# Patient Record
Sex: Male | Born: 1980 | Race: White | Hispanic: No | Marital: Married | Smoking: Current every day smoker
Health system: Southern US, Community
[De-identification: ages and names within clinical notes are randomized; demographics above are authoritative.]

## PROBLEM LIST (undated history)

## (undated) DIAGNOSIS — S069X9A Unspecified intracranial injury with loss of consciousness of unspecified duration, initial encounter: Secondary | ICD-10-CM

## (undated) DIAGNOSIS — F419 Anxiety disorder, unspecified: Secondary | ICD-10-CM

## (undated) DIAGNOSIS — F1490 Cocaine use, unspecified, uncomplicated: Secondary | ICD-10-CM

## (undated) DIAGNOSIS — M1990 Unspecified osteoarthritis, unspecified site: Secondary | ICD-10-CM

## (undated) DIAGNOSIS — G43909 Migraine, unspecified, not intractable, without status migrainosus: Secondary | ICD-10-CM

## (undated) DIAGNOSIS — R569 Unspecified convulsions: Secondary | ICD-10-CM

## (undated) DIAGNOSIS — Z720 Tobacco use: Secondary | ICD-10-CM

## (undated) DIAGNOSIS — G44009 Cluster headache syndrome, unspecified, not intractable: Secondary | ICD-10-CM

## (undated) DIAGNOSIS — R51 Headache: Secondary | ICD-10-CM

## (undated) DIAGNOSIS — R519 Headache, unspecified: Secondary | ICD-10-CM

## (undated) DIAGNOSIS — F32A Depression, unspecified: Secondary | ICD-10-CM

## (undated) DIAGNOSIS — F329 Major depressive disorder, single episode, unspecified: Secondary | ICD-10-CM

## (undated) HISTORY — DX: Cluster headache syndrome, unspecified, not intractable: G44.009

## (undated) HISTORY — DX: Major depressive disorder, single episode, unspecified: F32.9

## (undated) HISTORY — DX: Migraine, unspecified, not intractable, without status migrainosus: G43.909

## (undated) HISTORY — DX: Unspecified osteoarthritis, unspecified site: M19.90

## (undated) HISTORY — DX: Anxiety disorder, unspecified: F41.9

## (undated) HISTORY — DX: Depression, unspecified: F32.A

---
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
## 2004-11-25 DIAGNOSIS — S069X9A Unspecified intracranial injury with loss of consciousness of unspecified duration, initial encounter: Secondary | ICD-10-CM

## 2004-11-25 DIAGNOSIS — S069XAA Unspecified intracranial injury with loss of consciousness status unknown, initial encounter: Secondary | ICD-10-CM

## 2004-11-25 HISTORY — PX: CRANIOTOMY: SHX93

## 2004-11-25 HISTORY — DX: Unspecified intracranial injury with loss of consciousness status unknown, initial encounter: S06.9XAA

## 2004-11-25 HISTORY — DX: Unspecified intracranial injury with loss of consciousness of unspecified duration, initial encounter: S06.9X9A

## 2009-10-23 ENCOUNTER — Emergency Department (HOSPITAL_COMMUNITY): Admission: EM | Admit: 2009-10-23 | Discharge: 2009-10-23 | Payer: Self-pay | Admitting: Emergency Medicine

## 2010-06-24 ENCOUNTER — Emergency Department (HOSPITAL_COMMUNITY): Admission: EM | Admit: 2010-06-24 | Discharge: 2010-06-25 | Payer: Self-pay | Admitting: Emergency Medicine

## 2010-12-31 ENCOUNTER — Emergency Department (HOSPITAL_COMMUNITY): Payer: Self-pay

## 2010-12-31 ENCOUNTER — Emergency Department (HOSPITAL_COMMUNITY)
Admission: EM | Admit: 2010-12-31 | Discharge: 2010-12-31 | Disposition: A | Discharge status: Home / Self Care | Payer: Self-pay | Attending: Emergency Medicine | Admitting: Emergency Medicine

## 2010-12-31 DIAGNOSIS — N201 Calculus of ureter: Secondary | ICD-10-CM | POA: Insufficient documentation

## 2010-12-31 DIAGNOSIS — R1032 Left lower quadrant pain: Secondary | ICD-10-CM | POA: Insufficient documentation

## 2010-12-31 DIAGNOSIS — R1110 Vomiting, unspecified: Secondary | ICD-10-CM | POA: Insufficient documentation

## 2010-12-31 DIAGNOSIS — R10814 Left lower quadrant abdominal tenderness: Secondary | ICD-10-CM | POA: Insufficient documentation

## 2010-12-31 LAB — URINALYSIS, ROUTINE W REFLEX MICROSCOPIC
Ketones, ur: 15 mg/dL — AB
Nitrite: NEGATIVE
pH: 6 (ref 5.0–8.0)

## 2010-12-31 LAB — URINE MICROSCOPIC-ADD ON

## 2010-12-31 MED ORDER — IOHEXOL 300 MG/ML  SOLN: 100.0000 mL | Freq: Once | INTRAMUSCULAR | Status: DC | PRN

## 2010-12-31 MED ORDER — IOHEXOL 300 MG/ML  SOLN
100.0000 mL | Freq: Once | INTRAMUSCULAR | Status: AC | PRN
  Administered 2010-12-31: 100 mL via INTRAVENOUS

## 2011-02-15 LAB — COMPREHENSIVE METABOLIC PANEL
ALT: 33 U/L (ref 0–53)
AST: 23 U/L (ref 0–37)
Calcium: 9.7 mg/dL (ref 8.4–10.5)
Creatinine, Ser: 1.23 mg/dL (ref 0.4–1.5)
GFR calc non Af Amer: >60 mL/min (ref >60)
Potassium: 4.3 mEq/L (ref 3.5–5.1)
Total Bilirubin: 0.8 mg/dL (ref 0.3–1.2)
Total Protein: 7.2 g/dL (ref 6.0–8.3)

## 2011-02-15 LAB — CBC
HCT: 45.5 % (ref 39.0–52.0)
MCH: 31 pg (ref 26.0–34.0)
MCHC: 34.7 g/dL (ref 30.0–36.0)
RDW: 13.2 % (ref 11.5–15.5)

## 2011-02-15 LAB — DIFFERENTIAL
Basophils Absolute: 0 10*3/uL (ref 0.0–0.1)
Basophils Relative: 0 % (ref 0–1)
Eosinophils Relative: 0 % (ref 0–5)
Lymphocytes Relative: 9 % — ABNORMAL LOW (ref 12–46)
Lymphs Abs: 1.3 10*3/uL (ref 0.7–4.0)
Monocytes Absolute: 0.9 10*3/uL (ref 0.1–1.0)
Neutro Abs: 12.6 10*3/uL — ABNORMAL HIGH (ref 1.7–7.7)
Neutrophils Relative %: 85 % — ABNORMAL HIGH (ref 43–77)

## 2011-02-15 LAB — LIPASE, BLOOD: Lipase: 21 U/L (ref 11–59)

## 2011-10-10 ENCOUNTER — Emergency Department (HOSPITAL_COMMUNITY)
Admission: EM | Admit: 2011-10-10 | Discharge: 2011-10-10 | Disposition: A | Discharge status: Home / Self Care | Payer: Medicaid Other | Attending: Emergency Medicine | Admitting: Emergency Medicine

## 2011-10-10 ENCOUNTER — Encounter: Payer: Self-pay | Admitting: Emergency Medicine

## 2011-10-10 DIAGNOSIS — W260XXA Contact with knife, initial encounter: Secondary | ICD-10-CM | POA: Insufficient documentation

## 2011-10-10 DIAGNOSIS — F17200 Nicotine dependence, unspecified, uncomplicated: Secondary | ICD-10-CM | POA: Insufficient documentation

## 2011-10-10 DIAGNOSIS — S1191XA Laceration without foreign body of unspecified part of neck, initial encounter: Secondary | ICD-10-CM

## 2011-10-10 DIAGNOSIS — S1190XA Unspecified open wound of unspecified part of neck, initial encounter: Secondary | ICD-10-CM | POA: Insufficient documentation

## 2011-10-10 DIAGNOSIS — Z87820 Personal history of traumatic brain injury: Secondary | ICD-10-CM | POA: Insufficient documentation

## 2011-10-10 HISTORY — DX: Headache, unspecified: R51.9

## 2011-10-10 HISTORY — DX: Unspecified intracranial injury with loss of consciousness of unspecified duration, initial encounter: S06.9X9A

## 2011-10-10 HISTORY — DX: Headache: R51

## 2011-10-10 MED ORDER — TETANUS-DIPHTH-ACELL PERTUSSIS 5-2.5-18.5 LF-MCG/0.5 IM SUSP
0.5000 mL | Freq: Once | INTRAMUSCULAR | Status: AC
  Administered 2011-10-10: 0.5 mL via INTRAMUSCULAR
  Filled 2011-10-10: qty 0.5
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
## 2011-10-10 NOTE — ED Notes (Signed)
ASSUMED CARE ON PT. , RESTING WITH NO PAIN OR DISCOMFORT , RESPIRATIONS UNLABORED, INTRODUCED SELF , CALL LIGHT WITH IN REACH , BEDRAILS UP.

## 2011-10-10 NOTE — ED Notes (Signed)
DR. Anitra Lauth AT BEDSIDE SUTURING PT.'S LACERATION.

## 2012-10-26 ENCOUNTER — Emergency Department (HOSPITAL_COMMUNITY)
Admission: EM | Admit: 2012-10-26 | Discharge: 2012-10-26 | Disposition: A | Discharge status: Home / Self Care | Payer: Medicaid Other | Attending: Emergency Medicine | Admitting: Emergency Medicine

## 2012-10-26 DIAGNOSIS — S0100XA Unspecified open wound of scalp, initial encounter: Secondary | ICD-10-CM | POA: Insufficient documentation

## 2012-10-26 DIAGNOSIS — Z79899 Other long term (current) drug therapy: Secondary | ICD-10-CM | POA: Insufficient documentation

## 2012-10-26 DIAGNOSIS — Z87820 Personal history of traumatic brain injury: Secondary | ICD-10-CM | POA: Insufficient documentation

## 2012-10-26 DIAGNOSIS — R51 Headache: Secondary | ICD-10-CM | POA: Insufficient documentation

## 2012-10-26 DIAGNOSIS — IMO0002 Reserved for concepts with insufficient information to code with codable children: Secondary | ICD-10-CM | POA: Insufficient documentation

## 2012-10-26 DIAGNOSIS — T148XXA Other injury of unspecified body region, initial encounter: Secondary | ICD-10-CM

## 2012-10-26 DIAGNOSIS — F17200 Nicotine dependence, unspecified, uncomplicated: Secondary | ICD-10-CM | POA: Insufficient documentation

## 2012-10-26 DIAGNOSIS — W503XXA Accidental bite by another person, initial encounter: Secondary | ICD-10-CM

## 2012-10-26 MED ORDER — SULFAMETHOXAZOLE-TRIMETHOPRIM 800-160 MG PO TABS: 1.0000 | ORAL_TABLET | Freq: Two times a day (BID) | ORAL | Status: DC

## 2012-10-26 MED ORDER — CLINDAMYCIN HCL 150 MG PO CAPS: 450.0000 mg | ORAL_CAPSULE | Freq: Three times a day (TID) | ORAL | Status: DC

## 2012-10-26 MED ORDER — TETANUS-DIPHTH-ACELL PERTUSSIS 5-2.5-18.5 LF-MCG/0.5 IM SUSP
0.5000 mL | Freq: Once | INTRAMUSCULAR | Status: AC
  Administered 2012-10-26: 0.5 mL via INTRAMUSCULAR
  Filled 2012-10-26: qty 0.5
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
## 2012-10-28 NOTE — ED Provider Notes (Signed)
Medical screening examination/treatment/procedure(s) were performed by non-physician practitioner and as supervising physician I was immediately available for consultation/collaboration.   Joya Gaskins, MD 10/28/12 [phone]

## 2013-10-26 DIAGNOSIS — Z0289 Encounter for other administrative examinations: Secondary | ICD-10-CM

## 2014-06-14 ENCOUNTER — Encounter (HOSPITAL_COMMUNITY): Payer: Self-pay | Admitting: Emergency Medicine

## 2014-06-14 ENCOUNTER — Emergency Department (HOSPITAL_COMMUNITY)
Admission: EM | Admit: 2014-06-14 | Discharge: 2014-06-14 | Disposition: A | Discharge status: Home / Self Care | Payer: Self-pay | Attending: Emergency Medicine | Admitting: Emergency Medicine

## 2014-06-14 ENCOUNTER — Emergency Department (HOSPITAL_COMMUNITY): Payer: Medicaid Other

## 2014-06-14 DIAGNOSIS — R1013 Epigastric pain: Secondary | ICD-10-CM | POA: Insufficient documentation

## 2014-06-14 DIAGNOSIS — Z880 Allergy status to penicillin: Secondary | ICD-10-CM | POA: Insufficient documentation

## 2014-06-14 DIAGNOSIS — F17200 Nicotine dependence, unspecified, uncomplicated: Secondary | ICD-10-CM | POA: Insufficient documentation

## 2014-06-14 DIAGNOSIS — Z87820 Personal history of traumatic brain injury: Secondary | ICD-10-CM | POA: Insufficient documentation

## 2014-06-14 DIAGNOSIS — Z79899 Other long term (current) drug therapy: Secondary | ICD-10-CM | POA: Insufficient documentation

## 2014-06-14 DIAGNOSIS — R1031 Right lower quadrant pain: Secondary | ICD-10-CM | POA: Insufficient documentation

## 2014-06-14 DIAGNOSIS — G40909 Epilepsy, unspecified, not intractable, without status epilepticus: Secondary | ICD-10-CM | POA: Insufficient documentation

## 2014-06-14 DIAGNOSIS — K5000 Crohn's disease of small intestine without complications: Secondary | ICD-10-CM | POA: Insufficient documentation

## 2014-06-14 DIAGNOSIS — R509 Fever, unspecified: Secondary | ICD-10-CM | POA: Insufficient documentation

## 2014-06-14 DIAGNOSIS — R112 Nausea with vomiting, unspecified: Secondary | ICD-10-CM | POA: Insufficient documentation

## 2014-06-14 DIAGNOSIS — D72829 Elevated white blood cell count, unspecified: Secondary | ICD-10-CM | POA: Insufficient documentation

## 2014-06-14 DIAGNOSIS — R197 Diarrhea, unspecified: Secondary | ICD-10-CM | POA: Insufficient documentation

## 2014-06-14 HISTORY — DX: Unspecified convulsions: R56.9

## 2014-06-14 LAB — CBC WITH DIFFERENTIAL/PLATELET
BASOS ABS: 0 10*3/uL (ref 0.0–0.1)
Basophils Relative: 0 % (ref 0–1)
Eosinophils Absolute: 0 10*3/uL (ref 0.0–0.7)
Eosinophils Relative: 0 % (ref 0–5)
HEMATOCRIT: 46 % (ref 39.0–52.0)
HEMOGLOBIN: 15.9 g/dL (ref 13.0–17.0)
LYMPHS ABS: 2.1 10*3/uL (ref 0.7–4.0)
Lymphocytes Relative: 15 % (ref 12–46)
MCH: 31.2 pg (ref 26.0–34.0)
MCHC: 34.6 g/dL (ref 30.0–36.0)
MCV: 90.4 fL (ref 78.0–100.0)
Monocytes Absolute: 1.2 10*3/uL — ABNORMAL HIGH (ref 0.1–1.0)
Monocytes Relative: 9 % (ref 3–12)
NEUTROS PCT: 76 % (ref 43–77)
Neutro Abs: 10.9 10*3/uL — ABNORMAL HIGH (ref 1.7–7.7)
PLATELETS: 173 10*3/uL (ref 150–400)
RBC: 5.09 MIL/uL (ref 4.22–5.81)
RDW: 14.2 % (ref 11.5–15.5)
WBC: 14.2 10*3/uL — ABNORMAL HIGH (ref 4.0–10.5)

## 2014-06-14 LAB — URINE MICROSCOPIC-ADD ON

## 2014-06-14 LAB — URINALYSIS, ROUTINE W REFLEX MICROSCOPIC
GLUCOSE, UA: NEGATIVE mg/dL
HGB URINE DIPSTICK: NEGATIVE
Ketones, ur: NEGATIVE mg/dL
Leukocytes, UA: NEGATIVE
NITRITE: NEGATIVE
PROTEIN: 30 mg/dL — AB
Specific Gravity, Urine: 1.03 (ref 1.005–1.030)
Urobilinogen, UA: 0.2 mg/dL (ref 0.0–1.0)
pH: 5.5 (ref 5.0–8.0)

## 2014-06-14 LAB — COMPREHENSIVE METABOLIC PANEL
ALT: 21 U/L (ref 0–53)
AST: 17 U/L (ref 0–37)
Albumin: 3.9 g/dL (ref 3.5–5.2)
Alkaline Phosphatase: 68 U/L (ref 39–117)
Anion gap: 15 (ref 5–15)
BILIRUBIN TOTAL: 0.4 mg/dL (ref 0.3–1.2)
BUN: 18 mg/dL (ref 6–23)
CHLORIDE: 102 meq/L (ref 96–112)
CO2: 20 meq/L (ref 19–32)
CREATININE: 1.07 mg/dL (ref 0.50–1.35)
Calcium: 9.3 mg/dL (ref 8.4–10.5)
GFR calc non Af Amer: >90 mL/min (ref >90)
Glucose, Bld: 111 mg/dL — ABNORMAL HIGH (ref 70–99)
POTASSIUM: 3.5 meq/L — AB (ref 3.7–5.3)
Sodium: 137 mEq/L (ref 137–147)
TOTAL PROTEIN: 8 g/dL (ref 6.0–8.3)

## 2014-06-14 LAB — LIPASE, BLOOD: LIPASE: 29 U/L (ref 11–59)

## 2014-06-14 MED ORDER — ONDANSETRON HCL 4 MG PO TABS: 4.0000 mg | ORAL_TABLET | Freq: Four times a day (QID) | ORAL | Status: DC

## 2014-06-14 MED ORDER — METRONIDAZOLE 500 MG PO TABS: 500.0000 mg | ORAL_TABLET | Freq: Three times a day (TID) | ORAL | Status: DC

## 2014-06-14 MED ORDER — IOHEXOL 300 MG/ML  SOLN
100.0000 mL | Freq: Once | INTRAMUSCULAR | Status: AC | PRN
  Administered 2014-06-14: 100 mL via INTRAVENOUS

## 2014-06-14 MED ORDER — MORPHINE SULFATE 4 MG/ML IJ SOLN
4.0000 mg | Freq: Once | INTRAMUSCULAR | Status: AC
  Administered 2014-06-14: 4 mg via INTRAVENOUS
  Filled 2014-06-14: qty 1

## 2014-06-14 MED ORDER — CIPROFLOXACIN IN D5W 400 MG/200ML IV SOLN
400.0000 mg | Freq: Once | INTRAVENOUS | Status: AC
  Administered 2014-06-14: 400 mg via INTRAVENOUS
  Filled 2014-06-14: qty 200

## 2014-06-14 MED ORDER — ONDANSETRON 4 MG PO TBDP
8.0000 mg | ORAL_TABLET | Freq: Once | ORAL | Status: AC
  Administered 2014-06-14: 8 mg via ORAL
  Filled 2014-06-14: qty 2

## 2014-06-14 MED ORDER — IOHEXOL 300 MG/ML  SOLN
25.0000 mL | INTRAMUSCULAR | Status: DC | PRN
Start: 2014-06-14 — End: 2014-06-15
  Administered 2014-06-14: 25 mL via ORAL

## 2014-06-14 MED ORDER — SODIUM CHLORIDE 0.9 % IV BOLUS (SEPSIS)
1000.0000 mL | Freq: Once | INTRAVENOUS | Status: AC
  Administered 2014-06-14: 1000 mL via INTRAVENOUS

## 2014-06-14 MED ORDER — CIPROFLOXACIN HCL 500 MG PO TABS: 500.0000 mg | ORAL_TABLET | Freq: Two times a day (BID) | ORAL | Status: DC

## 2014-06-14 MED ORDER — OXYCODONE-ACETAMINOPHEN 5-325 MG PO TABS
1.0000 | ORAL_TABLET | Freq: Once | ORAL | Status: AC
  Administered 2014-06-14: 1 via ORAL
  Filled 2014-06-14: qty 1

## 2014-06-14 MED ORDER — METRONIDAZOLE IN NACL 5-0.79 MG/ML-% IV SOLN
500.0000 mg | Freq: Once | INTRAVENOUS | Status: AC
  Administered 2014-06-14: 500 mg via INTRAVENOUS
  Filled 2014-06-14: qty 100

## 2014-06-14 MED ORDER — OXYCODONE-ACETAMINOPHEN 5-325 MG PO TABS: 1.0000 | ORAL_TABLET | Freq: Four times a day (QID) | ORAL | Status: DC | PRN

## 2014-06-14 MED ORDER — ONDANSETRON HCL 4 MG/2ML IJ SOLN
4.0000 mg | Freq: Once | INTRAMUSCULAR | Status: AC
  Administered 2014-06-14: 4 mg via INTRAVENOUS
  Filled 2014-06-14: qty 2

## 2014-06-14 NOTE — ED Notes (Signed)
Pt continues to be monitored by 5 lead, blood pressure, and pulse ox.  

## 2014-06-14 NOTE — ED Notes (Signed)
Fluid challenge given denies nausea, but continues to diarrhea.

## 2014-06-14 NOTE — ED Notes (Signed)
Pt alert x4 ambulates without distress. 

## 2014-06-14 NOTE — Discharge Instructions (Signed)
You have inflammation of your bowel which could be related to inflammatory bowel disease or infection.  You will be given antibiotics.  YOu need to follow-up with Dr. Juanda Chance, GI this week.  INformation about Crohn's Disease Crohn's disease is a long-term (chronic) soreness and redness (inflammation) of the intestines (bowel). It can affect any portion of the digestive tract, from the mouth to the anus. It can also cause problems outside the digestive tract. Crohn's disease is closely related to a disease called ulcerative colitis (together, these two diseases are called inflammatory bowel disease).  CAUSES  The cause of Crohn's disease is not known. One Nelva Bush is that, in an easily affected person, the immune system is triggered to attack the body's own digestive tissue. Crohn's disease runs in families. It seems to be more common in certain geographic areas and amongst certain races. There are no clear-cut dietary causes.  SYMPTOMS  Crohn's disease can cause many different symptoms since it can affect many different parts of the body. Symptoms include:  Fatigue.  Weight loss.  Chronic diarrhea, sometime bloody.  Abdominal pain and cramps.  Fever.  Ulcers or canker sores in the mouth or rectum.  Anemia (low red blood cells).  Arthritis, skin problems, and eye problems may occur. Complications of Crohn's disease can include:  Series of holes (perforation) of the bowel.  Portions of the intestines sticking to each other (adhesions).  Obstruction of the bowel.  Fistula formation, typically in the rectal area but also in other areas. A fistula is an opening between the bowels and the outside, or between the bowels and another organ.  A painful crack in the mucous membrane of the anus (rectal fissure). DIAGNOSIS  Your caregiver may suspect Crohn's disease based on your symptoms and an exam. Blood tests may confirm that there is a problem. You may be asked to submit a stool specimen for  examination. X-rays and CT scans may be necessary. Ultimately, the diagnosis is usually made after a procedure that uses a flexible tube that is inserted via your mouth or your anus. This is done under sedation and is called either an upper endoscopy or colonoscopy. With these tests, the specialist can take tiny tissue samples and remove them from the inside of the bowel (biopsy). Examination of this biopsy tissue under a microscope can reveal Crohn's disease as the cause of your symptoms. Due to the many different forms that Crohn's disease can take, symptoms may be present for several years before a diagnosis is made. TREATMENT  Medications are often used to decrease inflammation and control the immune system. These include medicines related to aspirin, steroid medications, and newer and stronger medications to slow down the immune system. Some medications may be used as suppositories or enemas. A number of other medications are used or have been studied. Your caregiver will make specific recommendations. HOME CARE INSTRUCTIONS   Symptoms such as diarrhea can be controlled with medications. Avoid foods that have a laxative effect such as fresh fruit, vegetables and dairy products. During flare ups, you can rest your bowel by refraining from solid foods. Drink clear liquids frequently during the day (electrolyte or re-hydrating fluids are best. Your caregiver can help you with suggestions). Drink often to prevent loss of body fluids (dehydration). When diarrhea has cleared, eat small meals and more frequently. Avoid food additives and stimulants such as caffeine (coffee, tea, or chocolate). Enzyme supplements may help if you develop intolerance to a sugar in dairy products (lactose). Ask your  caregiver or dietitian about specific dietary instructions.  Try to maintain a positive attitude. Learn relaxation techniques such as self hypnosis, mental imaging, and muscle relaxation.  If possible, avoid stresses  which can aggravate your condition.  Exercise regularly.  Follow your diet.  Always get plenty of rest. SEEK MEDICAL CARE IF:   Your symptoms fail to improve after a week or two of new treatment.  You experience continued weight loss.  You have ongoing cramps or loose bowels.  You develop a new skin rash, skin sores, or eye problems. SEEK IMMEDIATE MEDICAL CARE IF:   You have worsening of your symptoms or develop new symptoms.  You have a fever.  You develop bloody diarrhea.  You develop severe abdominal pain. MAKE SURE YOU:   Understand these instructions.  Will watch your condition.  Will get help right away if you are not doing well or get worse. Document Released: 08/21/2005 Document Revised: 03/08/2013 Document Reviewed: 07/20/2007 [facility]cExitCare Patient Information 2015 [employer], [place]. This information is not intended to replace advice given to you by your health care provider. Make sure you discuss any questions you have with your health care provider.

## 2014-06-14 NOTE — ED Notes (Signed)
Pt placed into gown and on monitor upon arrival to room. Pt monitored by 5 lead, blood pressure, and pulse ox. Asked pt to provide urine specimen pt stated he could not provide one at this time.

## 2014-06-14 NOTE — ED Notes (Addendum)
Pt reports generalized abd pain, back pain and having n/v/d and fever. Also having some pain with urination.

## 2014-06-14 NOTE — ED Provider Notes (Signed)
CSN: [id number]     Arrival date & time 06/14/14  1245 History   First MD Initiated Contact with Patient 06/14/14 1436     Chief Complaint  Patient presents with  . Fever  . Abdominal Pain     (Consider location/radiation/quality/duration/timing/severity/associated sxs/prior Treatment) HPI  This is a 33 year old male who presents with abdominal pain, nausea, vomiting, diarrhea and fever. Patient reports 2 days of symptoms. He reports temperature to 102. He reports. Umbilical abdominal pain that feels like "punching" it is nonradiating. Denies any right lower part her pain. Tylenol has helped. He reports anorexia and pain that is worse with eating. He endorses nonbilious, nonbloody emesis and diarrhea. No known sick contacts  Past Medical History  Diagnosis Date  . TBI (traumatic brain injury)   . Headache in back of head   . Seizures    History reviewed. No pertinent past surgical history. History reviewed. No pertinent family history. History  Substance Use Topics  . Smoking status: Current Every Day Smoker  . Smokeless tobacco: Not on file  . Alcohol Use: No    Review of Systems  Constitutional: Positive for fever and chills.  Respiratory: Negative.  Negative for chest tightness and shortness of breath.   Cardiovascular: Negative.  Negative for chest pain.  Gastrointestinal: Positive for nausea, vomiting, abdominal pain and diarrhea. Negative for constipation.  Genitourinary: Negative.  Negative for dysuria.  Musculoskeletal: Negative for back pain.  Skin: Negative for rash.  Neurological: Negative for headaches.  All other systems reviewed and are negative.     Allergies  Penicillins  Home Medications   Prior to Admission medications   Medication Sig Start Date End Date Taking? Authorizing Provider  levETIRAcetam (KEPPRA) 500 MG tablet Take 500 mg by mouth daily.   Yes Historical Provider, MD  ciprofloxacin (CIPRO) 500 MG tablet Take 1 tablet (500 mg total) by  mouth every 12 (twelve) hours. 06/14/14   Shon Baton, MD  metroNIDAZOLE (FLAGYL) 500 MG tablet Take 1 tablet (500 mg total) by mouth 3 (three) times daily. 06/14/14   Shon Baton, MD  ondansetron (ZOFRAN) 4 MG tablet Take 1 tablet (4 mg total) by mouth every 6 (six) hours. 06/14/14   Shon Baton, MD  oxyCODONE-acetaminophen (PERCOCET/ROXICET) 5-325 MG per tablet Take 1-2 tablets by mouth every 6 (six) hours as needed for moderate pain or severe pain. 06/14/14   Shon Baton, MD   BP 127/67  Pulse 97  Temp(Src) 99.7 F (37.6 C)  Resp 28  Ht 5\' 8"  (1.727 m)  Wt 180 lb (81.647 kg)  BMI 27.38 kg/m2  SpO2 97% Physical Exam  Nursing note and vitals reviewed. Constitutional: He is oriented to person, place, and time. He appears well-developed and well-nourished. No distress.  HENT:  Head: Normocephalic and atraumatic.  Mouth/Throat: Oropharynx is clear and moist.  Eyes: Pupils are equal, round, and reactive to light.  Neck: Neck supple.  Cardiovascular: Normal rate, regular rhythm and normal heart sounds.   No murmur heard. Pulmonary/Chest: Effort normal and breath sounds normal. No respiratory distress. He has no wheezes.  Abdominal: Soft. Bowel sounds are normal. There is tenderness. There is no rebound.  Tenderness over the epigastrium and right lower quadrant, positive Rovsing's  Musculoskeletal: He exhibits no edema.  Lymphadenopathy:    He has no cervical adenopathy.  Neurological: He is alert and oriented to person, place, and time.  Skin: Skin is warm and dry.  Psychiatric: He has a normal mood and  affect.    ED Course  Procedures (including critical care time) Labs Review Labs Reviewed  CBC WITH DIFFERENTIAL - Abnormal; Notable for the following:    WBC 14.2 (*)    Neutro Abs 10.9 (*)    Monocytes Absolute 1.2 (*)    All other components within normal limits  COMPREHENSIVE METABOLIC PANEL - Abnormal; Notable for the following:    Potassium 3.5 (*)     Glucose, Bld 111 (*)    All other components within normal limits  URINALYSIS, ROUTINE W REFLEX MICROSCOPIC - Abnormal; Notable for the following:    APPearance CLOUDY (*)    Bilirubin Urine SMALL (*)    Protein, ur 30 (*)    All other components within normal limits  URINE MICROSCOPIC-ADD ON - Abnormal; Notable for the following:    Bacteria, UA FEW (*)    Casts HYALINE CASTS (*)    All other components within normal limits  LIPASE, BLOOD    Imaging Review Ct Abdomen Pelvis W Contrast  06/14/2014   CLINICAL DATA:  Right mid to lower abdominal pain with fever.  EXAM: CT ABDOMEN AND PELVIS WITH CONTRAST  TECHNIQUE: Multidetector CT imaging of the abdomen and pelvis was performed using the standard protocol following bolus administration of intravenous contrast.  CONTRAST:  100mL OMNIPAQUE IOHEXOL 300 MG/ML  SOLN  COMPARISON:  12/31/2010  FINDINGS: Lower chest: Clear lung bases. Normal heart size without pericardial or pleural effusion. A tiny hiatal hernia.  Liver:  Normal, without focal lesion.  Spleen: Normal  Stomach: Normal distal stomach.  Pancreas: Normal, without mass or pancreatic ductal dilatation.  Gallbladder/Biliary Tree: Normal gallbladder, without intra or extrahepatic biliary ductal dilatation.  Kidneys/Adrenals: Normal adrenal glands. Normal appearance of the kidneys, without hydronephrosis.  Bowel loops: Suspicion of wall thickening, mild, involving the hepatic flexure and superior ascending colon. Moderate wall thickening is identified within the cecum and region of the ileocecal valve. Example image 47/series 201. There is moderate terminal ileal wall thickening, including on image 56/series 201.  Normal appendix.  Mildly prominent nodes in the ileocolic mesentery. These measure maximally 7 mm on image 49. Small bowel otherwise normal, without ascites.  Vascular: Mild but age advanced atherosclerosis, including within the right common iliac artery on image 4362.  Nodes: No  retroperitoneal or retrocrural adenopathy. No pelvic adenopathy.  Pelvic Genitourinary: Normal urinary bladder and prostate.  Other: No significant free fluid.  Bones/Musculoskeletal: Degenerative disc disease at the lumbosacral junction and L1-2 levels.  IMPRESSION: 1. Wall thickening involving the terminal ileum an adjacent cecum/proximal ascending colon. This could represent infectious enteritis/colitis versus inflammatory bowel disease (i.e. Crohn disease). Adjacent nodes in the mesentery are likely reactive. No evidence of obstruction, abscess, or other acute complication. 2. More equivocal hepatic flexure colonic wall thickening. This could be partially due to underdistention. 3. Mild but age advanced atherosclerosis.   Electronically Signed   By: Jeronimo GreavesKyle  Talbot M.D.   On: 06/14/2014 18:58     EKG Interpretation None      MDM   Final diagnoses:  Terminal ileitis, without complications    Patient presents with abdominal pain, vomiting and diarrhea. He is nontoxic on exam but appears dry. Tender to palpation. Considerations include colitis and appendicitis. Patient was given fluids, Zofran, and pain medication. He was sent for CT scan of the abdomen. Labs notable for leukocytosis. CT scan shows evidence of terminal ileitis. Patient would like to go home if at all possible. Discussed with Dr. Juanda ChanceBrodie, GI given concerns  for possible inflammatory bowel disease. Will place on Cipro and Flagyl and hold off on steroids at this time. He will need close GI followup. He is to call her office tomorrow. Patient was able to tolerate fluids. He will be discharged home on Cipro and Flagyl, pain medication, and nausea medicine. Patient was given strict return precautions.  After history, exam, and medical workup I feel the patient has been appropriately medically screened and is safe for discharge home. Pertinent diagnoses were discussed with the patient. Patient was given return precautions.     Shon Baton, MD 06/14/14 2031

## 2014-06-14 NOTE — ED Notes (Signed)
Patient transported to CT 

## 2014-06-15 ENCOUNTER — Encounter: Payer: Self-pay | Admitting: *Deleted

## 2014-06-15 ENCOUNTER — Telehealth: Payer: Self-pay | Admitting: *Deleted

## 2014-06-15 NOTE — Telephone Encounter (Signed)
Hart Carwinora M Brodie, MD Daphine Deutscheregina N Chayanne Speir, RN            Hello Rene Kocheregina, this pt was seen in ED tonight with ileitis on CT scan, may have infectious ileitis or Crohn's. He was started on Cipro/Flagyl and will call the office in am attn Mariaha Ellington to put him on anybodie's schedule, incuding extenders. Tjhanks DB         Left a message for patient to call me.

## 2014-06-15 NOTE — Telephone Encounter (Signed)
Spoke with patient's girlfriend and scheduled OV on 06/16/14 at 10:30 AM with Mike GipAmy Esterwood, PA.

## 2014-06-15 NOTE — Telephone Encounter (Signed)
error 

## 2014-06-16 ENCOUNTER — Encounter: Payer: Self-pay | Admitting: Physician Assistant

## 2014-06-16 ENCOUNTER — Other Ambulatory Visit: Payer: Self-pay | Admitting: *Deleted

## 2014-06-16 ENCOUNTER — Telehealth: Payer: Self-pay | Admitting: Physician Assistant

## 2014-06-16 ENCOUNTER — Other Ambulatory Visit: Payer: Medicaid Other

## 2014-06-16 ENCOUNTER — Ambulatory Visit (INDEPENDENT_AMBULATORY_CARE_PROVIDER_SITE_OTHER): Payer: Medicaid Other | Admitting: Physician Assistant

## 2014-06-16 VITALS — BP 120/72 | HR 88 | Ht 68.0 in | Wt 173.0 lb

## 2014-06-16 DIAGNOSIS — S069XAA Unspecified intracranial injury with loss of consciousness status unknown, initial encounter: Secondary | ICD-10-CM | POA: Insufficient documentation

## 2014-06-16 DIAGNOSIS — G40909 Epilepsy, unspecified, not intractable, without status epilepticus: Secondary | ICD-10-CM | POA: Insufficient documentation

## 2014-06-16 DIAGNOSIS — S069X9A Unspecified intracranial injury with loss of consciousness of unspecified duration, initial encounter: Secondary | ICD-10-CM | POA: Insufficient documentation

## 2014-06-16 DIAGNOSIS — A09 Infectious gastroenteritis and colitis, unspecified: Secondary | ICD-10-CM

## 2014-06-16 MED ORDER — OXYCODONE-ACETAMINOPHEN 5-325 MG PO TABS: 1.0000 | ORAL_TABLET | Freq: Four times a day (QID) | ORAL | Status: DC | PRN

## 2014-06-16 MED ORDER — ONDANSETRON 4 MG PO TBDP: 4.0000 mg | ORAL_TABLET | Freq: Once | ORAL | Status: DC

## 2014-06-16 MED ORDER — DICYCLOMINE HCL 10 MG PO CAPS: ORAL_CAPSULE | ORAL | Status: DC

## 2014-06-16 MED ORDER — ONDANSETRON 4 MG PO TBDP: 4.0000 mg | ORAL_TABLET | Freq: Three times a day (TID) | ORAL | Status: DC | PRN

## 2014-06-16 NOTE — Progress Notes (Addendum)
Subjective:    Patient ID: Ryan Blackburn, male    DOB: [birth year], 33 y.o.   MRN: [id number]  HPI  Ryan Blackburn is a pleasant 33 year old white male new to GI today referred through the emergency room. Patient has history of ureteral lithiasis, previous traumatic brain injury and a seizure disorder. He has no prior history of GI problems. He went to the emergency room on 06/14/2014 with a 2 day illness of abrupt onset with abdominal pain cramping watery diarrhea, nausea vomiting fever and chills. He says he was feeling fine and had not had any recent GI issues until he suddenly became ill. He is not aware of any infectious exposures, had eaten pizza and chicken salad the day prior to becoming ill. No other family members ill no recent antibiotics. Evaluation in the emergency room labs showed a WBC of 14.2 hemoglobin 15.9 hematocrit of 46 he met unremarkable. CT scan of the abdomen and pelvis was done and shows suspected wall thickening of the hepatic flexure and descending colon to the cecum and ileocecal valve and into the terminal ileum. Patient was started on Cipro and Flagyl which he is taking.  He says so far he really doesn't feel much better -he is not having as much  Fever- which had been to 102.2 the day of the emergency room visit. He continues with abdominal pain and cramping. He is trying to take po's. Though  had been told to stay on clear liquids. He continues to have 14-16 watery bowel movements per day sometimes very small volume "squirts" of stool. Stools have been nonbloody. He is still nauseated ,but not vomiting as often.    Review of Systems  Constitutional: Positive for appetite change.  HENT: Negative.   Eyes: Negative.   Respiratory: Negative.   Cardiovascular: Negative.   Gastrointestinal: Positive for nausea, vomiting, abdominal pain and diarrhea.  Endocrine: Negative.   Genitourinary: Negative.   Musculoskeletal: Negative.   Skin: Negative.   Allergic/Immunologic: Negative.     Neurological: Positive for weakness.  Hematological: Negative.   Psychiatric/Behavioral: Negative.    Outpatient Prescriptions Prior to Visit  Medication Sig Dispense Refill  . ciprofloxacin (CIPRO) 500 MG tablet Take 1 tablet (500 mg total) by mouth every 12 (twelve) hours.  28 tablet  0  . levETIRAcetam (KEPPRA) 500 MG tablet Take 500 mg by mouth daily.      . metroNIDAZOLE (FLAGYL) 500 MG tablet Take 1 tablet (500 mg total) by mouth 3 (three) times daily.  42 tablet  0  . ondansetron (ZOFRAN) 4 MG tablet Take 1 tablet (4 mg total) by mouth every 6 (six) hours.  12 tablet  0  . oxyCODONE-acetaminophen (PERCOCET/ROXICET) 5-325 MG per tablet Take 1-2 tablets by mouth every 6 (six) hours as needed for moderate pain or severe pain.  15 tablet  0   No facility-administered medications prior to visit.   Allergies  Allergen Reactions  . Penicillins Hives   Patient Active Problem List   Diagnosis Date Noted  . Seizure disorder 06/16/2014  . TBI (traumatic brain injury) 06/16/2014   History  Substance Use Topics  . Smoking status: Current Every Day Smoker  . Smokeless tobacco: Not on file  . Alcohol Use: No   has no family status information on file.     Objective:  Physical Exam   well-developed young white male in no acute distress, blood pressure 120/72 pulse 88 height 5 foot 8 weight 173. HEENT; nontraumatic normocephalic EOMI PERRLA sclera anicteric, Supple;  no JVD, Cardiovascular; regular rate and rhythm with S1-S2 no murmur or gallop, Pulmonary; clear bilaterally, Abdomen; soft bowel sounds are hyperactive he is tender across his lower abdomen right greater than left no guarding or rebound no palpable mass or hepatosplenomegaly, Rectal ;exam not done, Extremities; no clubbing ,cyanosis or edema skin warm and dry, Psych; mood and affect appropriate      Assessment & Plan:  #23  33 year old white male with acute diarrheal illness x5 days associated with fever chills abdominal  cramping profuse watery diarrhea nausea and vomiting. He had a leukocytosis but other labs unremarkable. CT scan showed thickening in the terminal ileum and and ascending colon . I suspect this is all infectious in etiology ,though  will have to consider underlying IBD ,this is felt less likely. #2 seizure disorder  Plan; GI pathogen panel Push oral fluids and advance to soft bland diet Patient is encouraged to finish his current course of Cipro and Flagyl x7 days Refill Zofran 4 mg every 6 hours when necessary for nausea Add Bentyl 10 mg a.c. for cramping and pain Refill Percocet 1 by mouth q. 6 hours when necessary for pain #20 no refills. . Patient is advised that this is very likely infectious and  that he should improve and resolve feels illness within the next week or so. Will make and I followup office visit in about 3 weeks, if he continues to have GI symptoms we'll consider further workup to rule out IBD however he was advised that if his symptoms have completely resolved and he is back to baseline he can cancel a followup appointment  Addendum: Reviewed and agree with initial management. Jerene Bears, MD

## 2014-06-16 NOTE — Patient Instructions (Addendum)
Please go to the basement level to our lab for stool test. Push fluids. Bland diet. Finish Cipro and Flagyl ( Metronidazole)   We sent prescriptions for Bentyl and refilled the Zoran for nausea. We have given you a refill on your Percocet to take to the pharmacy.  We made you an appointment to see Mike Gipmy Esterwood PA-C on 07-07-2014 at 10:30 am .  If you are significantly better you cancel the appointment 1-2 days ahead of time.

## 2014-06-17 ENCOUNTER — Telehealth: Payer: Self-pay | Admitting: Physician Assistant

## 2014-06-17 LAB — GASTROINTESTINAL PATHOGEN PANEL PCR
C. DIFFICILE TOX A/B, PCR: NEGATIVE
Campylobacter, PCR: NEGATIVE
Cryptosporidium, PCR: NEGATIVE
E COLI (STEC) STX1/STX2, PCR: NEGATIVE
E coli (ETEC) LT/ST PCR: NEGATIVE
E coli 0157, PCR: NEGATIVE
GIARDIA LAMBLIA, PCR: NEGATIVE
Norovirus, PCR: NEGATIVE
ROTAVIRUS, PCR: NEGATIVE
Salmonella, PCR: NEGATIVE
Shigella, PCR: NEGATIVE

## 2014-06-17 NOTE — Telephone Encounter (Signed)
Ok, yes he should discuss with urologist

## 2014-06-17 NOTE — Telephone Encounter (Signed)
Spoke with patient's girlfriend and she wants Amy Monica BectonEsterwood, [place] to know patient had blood in his urine when he urinated. She states she does have a urologist. Suggested she call the urologist about this.

## 2014-06-18 ENCOUNTER — Encounter (HOSPITAL_COMMUNITY): Payer: Self-pay | Admitting: Emergency Medicine

## 2014-06-18 ENCOUNTER — Emergency Department (HOSPITAL_COMMUNITY)
Admission: EM | Admit: 2014-06-18 | Discharge: 2014-06-18 | Discharge status: Against Medical Advice | Payer: Medicaid Other | Attending: Emergency Medicine | Admitting: Emergency Medicine

## 2014-06-18 DIAGNOSIS — R319 Hematuria, unspecified: Secondary | ICD-10-CM | POA: Insufficient documentation

## 2014-06-18 DIAGNOSIS — F17200 Nicotine dependence, unspecified, uncomplicated: Secondary | ICD-10-CM | POA: Insufficient documentation

## 2014-06-18 DIAGNOSIS — R1032 Left lower quadrant pain: Secondary | ICD-10-CM | POA: Insufficient documentation

## 2014-06-18 LAB — CBC WITH DIFFERENTIAL/PLATELET
BASOS PCT: 0 % (ref 0–1)
Basophils Absolute: 0 10*3/uL (ref 0.0–0.1)
EOS ABS: 0.1 10*3/uL (ref 0.0–0.7)
EOS PCT: 2 % (ref 0–5)
HCT: 43.4 % (ref 39.0–52.0)
Hemoglobin: 14.8 g/dL (ref 13.0–17.0)
LYMPHS ABS: 2.1 10*3/uL (ref 0.7–4.0)
Lymphocytes Relative: 37 % (ref 12–46)
MCH: 30.9 pg (ref 26.0–34.0)
MCHC: 34.1 g/dL (ref 30.0–36.0)
MCV: 90.6 fL (ref 78.0–100.0)
Monocytes Absolute: 0.3 10*3/uL (ref 0.1–1.0)
Monocytes Relative: 5 % (ref 3–12)
Neutro Abs: 3.1 10*3/uL (ref 1.7–7.7)
Neutrophils Relative %: 56 % (ref 43–77)
PLATELETS: 223 10*3/uL (ref 150–400)
RBC: 4.79 MIL/uL (ref 4.22–5.81)
RDW: 14.2 % (ref 11.5–15.5)
WBC: 5.7 10*3/uL (ref 4.0–10.5)

## 2014-06-18 LAB — COMPREHENSIVE METABOLIC PANEL
ALBUMIN: 3.4 g/dL — AB (ref 3.5–5.2)
ALT: 41 U/L (ref 0–53)
AST: 46 U/L — ABNORMAL HIGH (ref 0–37)
Alkaline Phosphatase: 56 U/L (ref 39–117)
Anion gap: 17 — ABNORMAL HIGH (ref 5–15)
BUN: 8 mg/dL (ref 6–23)
CALCIUM: 8.9 mg/dL (ref 8.4–10.5)
CO2: 21 mEq/L (ref 19–32)
Chloride: 103 mEq/L (ref 96–112)
Creatinine, Ser: 0.88 mg/dL (ref 0.50–1.35)
GFR calc non Af Amer: >90 mL/min (ref >90)
Glucose, Bld: 161 mg/dL — ABNORMAL HIGH (ref 70–99)
Potassium: 3.7 mEq/L (ref 3.7–5.3)
SODIUM: 141 meq/L (ref 137–147)
TOTAL PROTEIN: 6.8 g/dL (ref 6.0–8.3)
Total Bilirubin: 0.2 mg/dL — ABNORMAL LOW (ref 0.3–1.2)

## 2014-06-18 LAB — LIPASE, BLOOD: Lipase: 29 U/L (ref 11–59)

## 2014-06-18 NOTE — ED Notes (Signed)
Pt in c/o LLQ abd pain since Monday, also reports hematuria that recently started, states he was seen for same a few days ago and symptoms have returned

## 2014-06-20 ENCOUNTER — Other Ambulatory Visit: Payer: Self-pay

## 2014-06-23 ENCOUNTER — Telehealth: Payer: Self-pay | Admitting: Physician Assistant

## 2014-06-23 NOTE — Telephone Encounter (Signed)
Spoke with patient's girlfriend and gave her stool study results.

## 2014-07-07 ENCOUNTER — Ambulatory Visit: Payer: Medicaid Other | Admitting: Physician Assistant

## 2014-08-24 NOTE — Telephone Encounter (Signed)
See previous encounters

## 2015-04-17 ENCOUNTER — Encounter (HOSPITAL_COMMUNITY): Payer: Self-pay | Admitting: Emergency Medicine

## 2015-04-17 ENCOUNTER — Ambulatory Visit (HOSPITAL_COMMUNITY): Payer: Self-pay | Admitting: Psychiatry

## 2015-04-17 ENCOUNTER — Emergency Department (HOSPITAL_COMMUNITY)
Admission: EM | Admit: 2015-04-17 | Discharge: 2015-04-17 | Disposition: A | Discharge status: Home / Self Care | Payer: Self-pay | Attending: Emergency Medicine | Admitting: Emergency Medicine

## 2015-04-17 DIAGNOSIS — Z0000 Encounter for general adult medical examination without abnormal findings: Secondary | ICD-10-CM | POA: Insufficient documentation

## 2015-04-17 DIAGNOSIS — Z8659 Personal history of other mental and behavioral disorders: Secondary | ICD-10-CM | POA: Insufficient documentation

## 2015-04-17 DIAGNOSIS — Z792 Long term (current) use of antibiotics: Secondary | ICD-10-CM | POA: Insufficient documentation

## 2015-04-17 DIAGNOSIS — Z880 Allergy status to penicillin: Secondary | ICD-10-CM | POA: Insufficient documentation

## 2015-04-17 DIAGNOSIS — Z87820 Personal history of traumatic brain injury: Secondary | ICD-10-CM | POA: Insufficient documentation

## 2015-04-17 DIAGNOSIS — G40909 Epilepsy, unspecified, not intractable, without status epilepticus: Secondary | ICD-10-CM | POA: Insufficient documentation

## 2015-04-17 DIAGNOSIS — Z79899 Other long term (current) drug therapy: Secondary | ICD-10-CM | POA: Insufficient documentation

## 2015-04-17 NOTE — ED Notes (Signed)
Wife sitting and bedside. Pt calm and corporative.

## 2015-04-17 NOTE — ED Provider Notes (Signed)
CSN: [id number]     Arrival date & time 04/17/15  0820 History   None    No chief complaint on file.  Chief complaint here for evaluation so that the courts will allow me to see my daughter  (Consider location/radiation/quality/duration/timing/severity/associated sxs/prior Treatment) HPI Patient presents here for evaluation. Patient states the courts requested that he have an evaluation so that he can get custody of his daughter. Patient is asymptomatic. Denies wanting to harm himself or others. No other associated symptoms. Past Medical History  Diagnosis Date  . TBI (traumatic brain injury)   . Headache in back of head   . Seizures   . Depression   . Anxiety    Past Surgical History  Procedure Laterality Date  . Eye surgery     No family history on file. History  Substance Use Topics  . Smoking status: Current Every Day Smoker  . Smokeless tobacco: Not on file  . Alcohol Use: No    Review of Systems  Constitutional: Negative.   Psychiatric/Behavioral: Negative.       Allergies  Penicillins  Home Medications   Prior to Admission medications   Medication Sig Start Date End Date Taking? Authorizing Provider  ciprofloxacin (CIPRO) 500 MG tablet Take 1 tablet (500 mg total) by mouth every 12 (twelve) hours. 06/14/14   Shon Batonourtney F Horton, MD  dicyclomine (BENTYL) 10 MG capsule Take 1 tab before meals for cramping and spasms. 06/16/14   Amy S Esterwood, PA-C  levETIRAcetam (KEPPRA) 500 MG tablet Take 500 mg by mouth daily.    Historical Provider, MD  metroNIDAZOLE (FLAGYL) 500 MG tablet Take 1 tablet (500 mg total) by mouth 3 (three) times daily. 06/14/14   Shon Batonourtney F Horton, MD  ondansetron (ZOFRAN ODT) 4 MG disintegrating tablet Take 1 tablet (4 mg total) by mouth every 8 (eight) hours as needed for nausea or vomiting. 06/16/14   Amy S Esterwood, PA-C  ondansetron (ZOFRAN) 4 MG tablet Take 1 tablet (4 mg total) by mouth every 6 (six) hours. 06/14/14   Shon Batonourtney F Horton, MD   oxyCODONE-acetaminophen (PERCOCET/ROXICET) 5-325 MG per tablet Take 1-2 tablets by mouth every 6 (six) hours as needed for moderate pain or severe pain. 06/16/14   Amy S Esterwood, PA-C   There were no vitals taken for this visit. Physical Exam  Constitutional: He is oriented to person, place, and time. He appears well-developed and well-nourished.  HENT:  Head: Normocephalic and atraumatic.  Eyes: Conjunctivae are normal. Pupils are equal, round, and reactive to light.  Neck: Neck supple. No tracheal deviation present. No thyromegaly present.  Cardiovascular: Normal rate and regular rhythm.   No murmur heard. Pulmonary/Chest: Effort normal and breath sounds normal.  Abdominal: Soft. Bowel sounds are normal. He exhibits no distension. There is no tenderness.  Musculoskeletal: Normal range of motion. He exhibits no edema or tenderness.  Neurological: He is alert and oriented to person, place, and time. No cranial nerve deficit. Coordination normal.  Skin: Skin is warm and dry. No rash noted.  Psychiatric: He has a normal mood and affect.  Nursing note and vitals reviewed.   ED Course  Procedures (including critical care time) Labs Review Labs Reviewed - No data to display  Imaging Review No results found.   EKG Interpretation None      MDM  Patient is medically stable. His wife later reported to me that he has an appointment with Dr.Arfeen to get evaluated psychiatrically for custody. He mistakenly came to the  emergency department. I attempted to call Dr. Sheela Stack office , which is currently closed Diagnosis normal exam  Final diagnoses:  None        Doug Sou, MD 04/17/15 [phone]

## 2015-04-17 NOTE — ED Notes (Signed)
Pt here for medical clearance, psych eval for custody of his child. Pt denies SI/HI.

## 2015-04-17 NOTE — Discharge Instructions (Signed)
Call Dr. Lolly MustacheArfeen this morning to have evaluation

## 2015-04-28 ENCOUNTER — Ambulatory Visit (HOSPITAL_COMMUNITY): Payer: Self-pay | Admitting: Psychiatry

## 2015-05-16 ENCOUNTER — Encounter (HOSPITAL_COMMUNITY): Payer: Self-pay | Admitting: Nurse Practitioner

## 2015-05-16 ENCOUNTER — Emergency Department (HOSPITAL_COMMUNITY)
Admission: EM | Admit: 2015-05-16 | Discharge: 2015-05-16 | Disposition: A | Discharge status: Home / Self Care | Payer: Self-pay | Attending: Emergency Medicine | Admitting: Emergency Medicine

## 2015-05-16 DIAGNOSIS — R112 Nausea with vomiting, unspecified: Secondary | ICD-10-CM | POA: Insufficient documentation

## 2015-05-16 DIAGNOSIS — Z87820 Personal history of traumatic brain injury: Secondary | ICD-10-CM | POA: Insufficient documentation

## 2015-05-16 DIAGNOSIS — Z880 Allergy status to penicillin: Secondary | ICD-10-CM | POA: Insufficient documentation

## 2015-05-16 DIAGNOSIS — Z79899 Other long term (current) drug therapy: Secondary | ICD-10-CM | POA: Insufficient documentation

## 2015-05-16 DIAGNOSIS — Z8659 Personal history of other mental and behavioral disorders: Secondary | ICD-10-CM | POA: Insufficient documentation

## 2015-05-16 DIAGNOSIS — R42 Dizziness and giddiness: Secondary | ICD-10-CM | POA: Insufficient documentation

## 2015-05-16 DIAGNOSIS — G40909 Epilepsy, unspecified, not intractable, without status epilepticus: Secondary | ICD-10-CM | POA: Insufficient documentation

## 2015-05-16 DIAGNOSIS — Z720 Tobacco use: Secondary | ICD-10-CM | POA: Insufficient documentation

## 2015-05-16 LAB — CBC WITH DIFFERENTIAL/PLATELET
BASOS ABS: 0.1 10*3/uL (ref 0.0–0.1)
BASOS PCT: 1 % (ref 0–1)
EOS ABS: 0.3 10*3/uL (ref 0.0–0.7)
Eosinophils Relative: 4 % (ref 0–5)
HCT: 43.4 % (ref 39.0–52.0)
Hemoglobin: 15.1 g/dL (ref 13.0–17.0)
Lymphocytes Relative: 39 % (ref 12–46)
Lymphs Abs: 3 10*3/uL (ref 0.7–4.0)
MCH: 31.8 pg (ref 26.0–34.0)
MCHC: 34.8 g/dL (ref 30.0–36.0)
MCV: 91.4 fL (ref 78.0–100.0)
MONO ABS: 0.7 10*3/uL (ref 0.1–1.0)
Monocytes Relative: 9 % (ref 3–12)
NEUTROS ABS: 3.5 10*3/uL (ref 1.7–7.7)
Neutrophils Relative %: 47 % (ref 43–77)
PLATELETS: 202 10*3/uL (ref 150–400)
RBC: 4.75 MIL/uL (ref 4.22–5.81)
RDW: 13.6 % (ref 11.5–15.5)
WBC: 7.6 10*3/uL (ref 4.0–10.5)

## 2015-05-16 LAB — COMPREHENSIVE METABOLIC PANEL
ALK PHOS: 55 U/L (ref 38–126)
ALT: 41 U/L (ref 17–63)
AST: 48 U/L — ABNORMAL HIGH (ref 15–41)
Albumin: 3.6 g/dL (ref 3.5–5.0)
Anion gap: 11 (ref 5–15)
BUN: 14 mg/dL (ref 6–20)
CHLORIDE: 106 mmol/L (ref 101–111)
CO2: 22 mmol/L (ref 22–32)
CREATININE: 0.92 mg/dL (ref 0.61–1.24)
Calcium: 8.9 mg/dL (ref 8.9–10.3)
GFR calc Af Amer: >60 mL/min (ref >60)
GFR calc non Af Amer: >60 mL/min (ref >60)
GLUCOSE: 109 mg/dL — AB (ref 65–99)
Potassium: 5 mmol/L (ref 3.5–5.1)
Sodium: 139 mmol/L (ref 135–145)
Total Bilirubin: 1.5 mg/dL — ABNORMAL HIGH (ref 0.3–1.2)

## 2015-05-16 NOTE — Discharge Instructions (Signed)
Dizziness Take your Keppra as prescribed. Follow-up with your physician, Dr. Delrae Alfred as scheduled next week. Dizziness is a common problem. It is a feeling of unsteadiness or light-headedness. You may feel like you are about to faint. Dizziness can lead to injury if you stumble or fall. A person of any age group can suffer from dizziness, but dizziness is more common in older adults. CAUSES  Dizziness can be caused by many different things, including:  Middle ear problems.  Standing for too long.  Infections.  An allergic reaction.  Aging.  An emotional response to something, such as the sight of blood.  Side effects of medicines.  Tiredness.  Problems with circulation or blood pressure.  Excessive use of alcohol or medicines, or illegal drug use.  Breathing too fast (hyperventilation).  An irregular heart rhythm (arrhythmia).  A low red blood cell count (anemia).  Pregnancy.  Vomiting, diarrhea, fever, or other illnesses that cause body fluid loss (dehydration).  Diseases or conditions such as Parkinson's disease, high blood pressure (hypertension), diabetes, and thyroid problems.  Exposure to extreme heat. DIAGNOSIS  Your health care provider will ask about your symptoms, perform a physical exam, and perform an electrocardiogram (ECG) to record the electrical activity of your heart. Your health care provider may also perform other heart or blood tests to determine the cause of your dizziness. These may include:  Transthoracic echocardiogram (TTE). During echocardiography, sound waves are used to evaluate how blood flows through your heart.  Transesophageal echocardiogram (TEE).  Cardiac monitoring. This allows your health care provider to monitor your heart rate and rhythm in real time.  Holter monitor. This is a portable device that records your heartbeat and can help diagnose heart arrhythmias. It allows your health care provider to track your heart activity for  several days if needed.  Stress tests by exercise or by giving medicine that makes the heart beat faster. TREATMENT  Treatment of dizziness depends on the cause of your symptoms and can vary greatly. HOME CARE INSTRUCTIONS   Drink enough fluids to keep your urine clear or pale yellow. This is especially important in very hot weather. In older adults, it is also important in cold weather.  Take your medicine exactly as directed if your dizziness is caused by medicines. When taking blood pressure medicines, it is especially important to get up slowly.  Rise slowly from chairs and steady yourself until you feel okay.  In the morning, first sit up on the side of the bed. When you feel okay, stand slowly while holding onto something until you know your balance is fine.  Move your legs often if you need to stand in one place for a long time. Tighten and relax your muscles in your legs while standing.  Have someone stay with you for 1-2 days if dizziness continues to be a problem. Do this until you feel you are well enough to stay alone. Have the person call your health care provider if he or she notices changes in you that are concerning.  Do not drive or use heavy machinery if you feel dizzy.  Do not drink alcohol. SEEK IMMEDIATE MEDICAL CARE IF:   Your dizziness or light-headedness gets worse.  You feel nauseous or vomit.  You have problems talking, walking, or using your arms, hands, or legs.  You feel weak.  You are not thinking clearly or you have trouble forming sentences. It may take a friend or family member to notice this.  You have chest  pain, abdominal pain, shortness of breath, or sweating.  Your vision changes.  You notice any bleeding.  You have side effects from medicine that seems to be getting worse rather than better. MAKE SURE YOU:   Understand these instructions.  Will watch your condition.  Will get help right away if you are not doing well or get  worse. Document Released: 05/07/2001 Document Revised: 11/16/2013 Document Reviewed: 05/31/2011 [facility] Patient Information 2015 [place], [place]. This information is not intended to replace advice given to you by your health care provider. Make sure you discuss any questions you have with your health care provider.
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
## 2015-05-16 NOTE — ED Notes (Signed)
He states he feels like he is going to have a seizure. He states he has a tingly headache.  He has a history of seizures and takes keppra daily as prescribed.  He is a&ox4, breathing easily.

## 2015-05-17 LAB — CBG MONITORING, ED: Glucose-Capillary: 136 mg/dL — ABNORMAL HIGH (ref 65–99)

## 2015-06-09 ENCOUNTER — Ambulatory Visit (HOSPITAL_COMMUNITY): Payer: Self-pay | Admitting: Psychiatry

## 2015-06-23 ENCOUNTER — Ambulatory Visit (INDEPENDENT_AMBULATORY_CARE_PROVIDER_SITE_OTHER): Payer: Self-pay | Admitting: Psychiatry

## 2015-06-23 ENCOUNTER — Encounter (HOSPITAL_COMMUNITY): Payer: Self-pay | Admitting: Psychiatry

## 2015-06-23 VITALS — BP 127/80 | HR 78 | Ht 69.0 in | Wt 180.0 lb

## 2015-06-23 DIAGNOSIS — S069X5A Unspecified intracranial injury with loss of consciousness greater than 24 hours with return to pre-existing conscious level, initial encounter: Secondary | ICD-10-CM

## 2015-06-23 DIAGNOSIS — Z653 Problems related to other legal circumstances: Secondary | ICD-10-CM
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
## 2015-07-25 ENCOUNTER — Ambulatory Visit (INDEPENDENT_AMBULATORY_CARE_PROVIDER_SITE_OTHER): Payer: No Typology Code available for payment source | Admitting: Psychiatry

## 2015-07-25 ENCOUNTER — Encounter (HOSPITAL_COMMUNITY): Payer: Self-pay | Admitting: Psychiatry

## 2015-07-25 VITALS — BP 122/80 | HR 96 | Ht 69.0 in | Wt 177.0 lb

## 2015-07-25 DIAGNOSIS — R29818 Other symptoms and signs involving the nervous system: Secondary | ICD-10-CM

## 2015-07-25 DIAGNOSIS — Z653 Problems related to other legal circumstances: Secondary | ICD-10-CM

## 2015-07-25 DIAGNOSIS — F4321 Adjustment disorder with depressed mood: Secondary | ICD-10-CM

## 2015-07-25 DIAGNOSIS — R4189 Other symptoms and signs involving cognitive functions and awareness: Secondary | ICD-10-CM
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
## 2015-09-21 ENCOUNTER — Ambulatory Visit (INDEPENDENT_AMBULATORY_CARE_PROVIDER_SITE_OTHER): Payer: No Typology Code available for payment source | Admitting: Psychiatry

## 2015-09-21 ENCOUNTER — Encounter (HOSPITAL_COMMUNITY): Payer: Self-pay | Admitting: Psychiatry

## 2015-09-21 VITALS — BP 124/70 | HR 85 | Ht 67.0 in | Wt 180.0 lb

## 2015-09-21 DIAGNOSIS — R29818 Other symptoms and signs involving the nervous system: Secondary | ICD-10-CM

## 2015-09-21 DIAGNOSIS — R4189 Other symptoms and signs involving cognitive functions and awareness: Secondary | ICD-10-CM

## 2015-09-21 DIAGNOSIS — F4321 Adjustment disorder with depressed mood: Secondary | ICD-10-CM

## 2015-09-21 DIAGNOSIS — Z653 Problems related to other legal circumstances: Secondary | ICD-10-CM
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
## 2015-09-29 ENCOUNTER — Ambulatory Visit (INDEPENDENT_AMBULATORY_CARE_PROVIDER_SITE_OTHER): Payer: Self-pay | Admitting: Internal Medicine

## 2015-09-29 ENCOUNTER — Encounter: Payer: Self-pay | Admitting: Internal Medicine

## 2015-09-29 VITALS — BP 130/90 | HR 80 | Ht 67.0 in | Wt 178.0 lb

## 2015-09-29 DIAGNOSIS — G8929 Other chronic pain: Secondary | ICD-10-CM

## 2015-09-29 DIAGNOSIS — G40909 Epilepsy, unspecified, not intractable, without status epilepticus: Secondary | ICD-10-CM

## 2015-09-29 DIAGNOSIS — R51 Headache: Secondary | ICD-10-CM

## 2015-09-29 MED ORDER — LEVETIRACETAM 250 MG PO TABS: ORAL_TABLET | ORAL | Status: DC

## 2015-09-29 NOTE — Patient Instructions (Signed)
Call if any problems cutting pills or if questions about how to gradually increase dose.

## 2015-10-01 ENCOUNTER — Encounter: Payer: Self-pay | Admitting: Internal Medicine

## 2015-10-01 DIAGNOSIS — R51 Headache: Secondary | ICD-10-CM

## 2015-10-01 DIAGNOSIS — R519 Headache, unspecified: Secondary | ICD-10-CM | POA: Insufficient documentation

## 2015-10-01 DIAGNOSIS — G8929 Other chronic pain: Secondary | ICD-10-CM | POA: Insufficient documentation

## 2015-10-01 NOTE — Progress Notes (Signed)
   Subjective:    Patient ID: Ryan Blackburn, male    DOB: 09/30/1981, 34 y.o.   MRN: [id number]  HPI  1.  Seizure Disorder: Has been without Keppra since about mid July.  Last seizure was about 2 months ago.  Has felt like he will have a seizure a couple of times. Reviewed his old records with him.  Had hives with Dilantin.  He was sent from [facility]ford [facility] at  [facility] to Dr[facility]  Anne [facility] at [facility] in 2013 and placed on Depakote, but this was stopped due to a very high ammonia level with the Depakote.  Was on the med for a very short time.   Was subsequently switched to Levatiracetam or Keppra.  Pt. Has only taken 500 mg at bedtime, but finds himself really sleepy and uncoordinated when taking Keppra.  States he has taken it regularly for a month with no improvement in these symptoms.  2.  Headaches: Previously treated with Tramadol for headaches.  Just made him sleep, but did not really resolve the headache pain.   Dr. Anne Blackburn also put pt. Through trial of Imitrex for the headache pain.  Pt. Does not recall that really helping as well.  3.  Tobacco Abuse:  Pt. Continues to smoke 1/2 ppd of cigarettes.  Smoked since age 34 yo.  Used to smoke MJ, but not now.  Wife is pregnant with twins.  She does not smoke and would like for him to stop.  Not clear he is interested in doing so.    4.  History of Depression:  Since last visit, pt. Has been evaluated by Behavioral Health at [facility].  He is not felt to have Depression or anxiety issues.      Review of Systems     Objective:   Physical Exam NAD  Not slumped over today and falling asleep as in last visit. Very alert. Lungs: CTA CV:  RRR without murmur or rub Neuro:  Alert, CN II-XII grossly intact, Motor 5/5 throughout, DTRs 2+/4 throughout Finger to nose to finger normal,Rapid alternating motions WNL, Romberg negative, Gait steady       Assessment & Plan:  1.  Seizure Disorder:  Somewhat limited on medication due  to cost and tolerance/allergy issues.   Restart Levetiracetam at a dose of (half of 250 mg tab) 125 mg nightly for 1-2 weeks, then increase to 3/4 tab.  Only able to get him [facility] with 250 mg tabs.  Will see if he tolerates better if gradually increases the dose.  If tolerating titration fine, can increase to full 250 mg in another 1-2 weeks.  2.  Headaches:  Would like to get seizures under control before starting other medication.  He seems fine with this.  3.  Tobacco Abuse:  STrongly urged pt. To consider stopping with nicotine patches.  To start with 21 mg patches for 4 weeks, then 14 mg patches for 2 and then 7 mg for 2 weeks.  Wrote this out on already printed pt. Instructions for pt.

## 2015-10-20 ENCOUNTER — Emergency Department (HOSPITAL_COMMUNITY): Admission: EM | Admit: 2015-10-20 | Discharge: 2015-10-20 | Discharge status: Against Medical Advice | Payer: Self-pay

## 2015-10-20 NOTE — ED Notes (Signed)
Call no answer 

## 2015-10-30 ENCOUNTER — Ambulatory Visit: Payer: Self-pay | Admitting: Internal Medicine

## 2015-12-04 ENCOUNTER — Ambulatory Visit (HOSPITAL_COMMUNITY): Payer: Self-pay | Admitting: Psychiatry

## 2015-12-08 ENCOUNTER — Ambulatory Visit (INDEPENDENT_AMBULATORY_CARE_PROVIDER_SITE_OTHER): Payer: No Typology Code available for payment source | Admitting: Psychiatry

## 2015-12-08 ENCOUNTER — Encounter (HOSPITAL_COMMUNITY): Payer: Self-pay | Admitting: Psychiatry

## 2015-12-08 VITALS — BP 128/70 | HR 82 | Ht 67.0 in | Wt 191.0 lb

## 2015-12-08 DIAGNOSIS — Z653 Problems related to other legal circumstances: Secondary | ICD-10-CM

## 2015-12-08 DIAGNOSIS — F4321 Adjustment disorder with depressed mood: Secondary | ICD-10-CM

## 2015-12-08 DIAGNOSIS — R29818 Other symptoms and signs involving the nervous system: Secondary | ICD-10-CM

## 2015-12-08 DIAGNOSIS — R4189 Other symptoms and signs involving cognitive functions and awareness: Secondary | ICD-10-CM

## 2015-12-08 MED ORDER — ESCITALOPRAM OXALATE 10 MG PO TABS: 10.0000 mg | ORAL_TABLET | Freq: Every day | ORAL | Status: DC
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
## 2016-01-05 ENCOUNTER — Ambulatory Visit (HOSPITAL_COMMUNITY): Payer: Self-pay | Admitting: Psychiatry

## 2016-02-20 ENCOUNTER — Encounter (HOSPITAL_COMMUNITY): Payer: Self-pay | Admitting: Psychiatry

## 2016-02-20 ENCOUNTER — Ambulatory Visit (INDEPENDENT_AMBULATORY_CARE_PROVIDER_SITE_OTHER): Payer: No Typology Code available for payment source | Admitting: Psychiatry

## 2016-02-20 VITALS — BP 128/80 | HR 92 | Ht 67.0 in | Wt 189.0 lb

## 2016-02-20 DIAGNOSIS — F4321 Adjustment disorder with depressed mood: Secondary | ICD-10-CM

## 2016-02-20 DIAGNOSIS — R4189 Other symptoms and signs involving cognitive functions and awareness: Secondary | ICD-10-CM

## 2016-02-20 DIAGNOSIS — R29818 Other symptoms and signs involving the nervous system: Secondary | ICD-10-CM

## 2016-02-20 DIAGNOSIS — Z653 Problems related to other legal circumstances: Secondary | ICD-10-CM

## 2016-02-20 MED ORDER — SERTRALINE HCL 25 MG PO TABS: 25.0000 mg | ORAL_TABLET | Freq: Every day | ORAL | Status: DC
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
## 2016-03-19 ENCOUNTER — Ambulatory Visit (HOSPITAL_COMMUNITY): Payer: Self-pay | Admitting: Psychiatry

## 2018-02-01 ENCOUNTER — Encounter: Payer: Self-pay | Admitting: Emergency Medicine

## 2018-02-01 ENCOUNTER — Emergency Department: Payer: Medicare Other

## 2018-02-01 ENCOUNTER — Emergency Department
Admission: EM | Admit: 2018-02-01 | Discharge: 2018-02-02 | Disposition: A | Discharge status: Home / Self Care | Payer: Medicare Other | Attending: Emergency Medicine | Admitting: Emergency Medicine

## 2018-02-01 ENCOUNTER — Other Ambulatory Visit: Payer: Self-pay

## 2018-02-01 DIAGNOSIS — R079 Chest pain, unspecified: Secondary | ICD-10-CM | POA: Diagnosis not present

## 2018-02-01 DIAGNOSIS — F17210 Nicotine dependence, cigarettes, uncomplicated: Secondary | ICD-10-CM | POA: Insufficient documentation

## 2018-02-01 DIAGNOSIS — Z79899 Other long term (current) drug therapy: Secondary | ICD-10-CM | POA: Diagnosis not present

## 2018-02-01 LAB — CBC
HCT: 46.3 % (ref 40.0–52.0)
Hemoglobin: 15.6 g/dL (ref 13.0–18.0)
MCH: 30.5 pg (ref 26.0–34.0)
MCHC: 33.7 g/dL (ref 32.0–36.0)
MCV: 90.5 fL (ref 80.0–100.0)
Platelets: 204 10*3/uL (ref 150–440)
RBC: 5.11 MIL/uL (ref 4.40–5.90)
RDW: 13.8 % (ref 11.5–14.5)
WBC: 7.5 10*3/uL (ref 3.8–10.6)

## 2018-02-01 LAB — BASIC METABOLIC PANEL
Anion gap: 7 (ref 5–15)
BUN: 19 mg/dL (ref 6–20)
CALCIUM: 9.1 mg/dL (ref 8.9–10.3)
CO2: 26 mmol/L (ref 22–32)
Chloride: 107 mmol/L (ref 101–111)
Creatinine, Ser: 1.03 mg/dL (ref 0.61–1.24)
GFR calc non Af Amer: >60 mL/min (ref >60)
Glucose, Bld: 117 mg/dL — ABNORMAL HIGH (ref 65–99)
POTASSIUM: 3.8 mmol/L (ref 3.5–5.1)
SODIUM: 140 mmol/L (ref 135–145)

## 2018-02-01 LAB — LIPASE, BLOOD: LIPASE: 32 U/L (ref 11–51)

## 2018-02-01 LAB — TROPONIN I
Troponin I: <0.03 ng/mL (ref <0.03)
Troponin I: <0.03 ng/mL (ref <0.03)

## 2018-02-01 MED ORDER — GI COCKTAIL ~~LOC~~
30.0000 mL | Freq: Once | ORAL | Status: AC
  Administered 2018-02-01: 30 mL via ORAL
  Filled 2018-02-01: qty 30

## 2018-02-01 MED ORDER — SUCRALFATE 1 G PO TABS
1.0000 g | ORAL_TABLET | Freq: Once | ORAL | Status: AC
  Administered 2018-02-01: 1 g via ORAL
  Filled 2018-02-01: qty 1

## 2018-02-01 NOTE — ED Triage Notes (Addendum)
Pt reports intermittent left sided chest pain since 5am today; pain radiates down left arm; says it's a "funny feeling" with intermittent sharp pains; pt reports chest wall tenderness as well to left breast area-no redness or bruising noted to area; no history of cardiac issues; reports shortness of breath, nausea;
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
## 2018-02-02 MED ORDER — OMEPRAZOLE 40 MG PO CPDR: 40.0000 mg | DELAYED_RELEASE_CAPSULE | Freq: Every day | ORAL | 0 refills | Status: DC

## 2018-02-02 NOTE — Discharge Instructions (Signed)
Your workup at this time is negative.  Please follow-up with your primary care physician for further evaluation of your chest pain.  Please take the medication I have prescribed as that will help with this abdominal pain.  Please return with any worsening conditions or any other concerns.

## 2018-02-24 ENCOUNTER — Emergency Department (HOSPITAL_COMMUNITY)
Admission: EM | Admit: 2018-02-24 | Discharge: 2018-02-25 | Disposition: A | Discharge status: Home / Self Care | Payer: Medicare Other | Attending: Emergency Medicine | Admitting: Emergency Medicine

## 2018-02-24 ENCOUNTER — Other Ambulatory Visit: Payer: Self-pay

## 2018-02-24 ENCOUNTER — Encounter (HOSPITAL_COMMUNITY): Payer: Self-pay | Admitting: Emergency Medicine

## 2018-02-24 DIAGNOSIS — F1120 Opioid dependence, uncomplicated: Secondary | ICD-10-CM | POA: Diagnosis not present

## 2018-02-24 DIAGNOSIS — F1990 Other psychoactive substance use, unspecified, uncomplicated: Secondary | ICD-10-CM | POA: Diagnosis not present

## 2018-02-24 DIAGNOSIS — F17210 Nicotine dependence, cigarettes, uncomplicated: Secondary | ICD-10-CM | POA: Insufficient documentation

## 2018-02-24 DIAGNOSIS — F1210 Cannabis abuse, uncomplicated: Secondary | ICD-10-CM | POA: Insufficient documentation

## 2018-02-24 DIAGNOSIS — R45851 Suicidal ideations: Secondary | ICD-10-CM

## 2018-02-24 DIAGNOSIS — F1410 Cocaine abuse, uncomplicated: Secondary | ICD-10-CM | POA: Insufficient documentation

## 2018-02-24 DIAGNOSIS — F329 Major depressive disorder, single episode, unspecified: Secondary | ICD-10-CM | POA: Diagnosis present

## 2018-02-24 DIAGNOSIS — F322 Major depressive disorder, single episode, severe without psychotic features: Secondary | ICD-10-CM | POA: Insufficient documentation

## 2018-02-24 LAB — COMPREHENSIVE METABOLIC PANEL
ALT: 34 U/L (ref 17–63)
ANION GAP: 11 (ref 5–15)
AST: 28 U/L (ref 15–41)
Albumin: 4 g/dL (ref 3.5–5.0)
Alkaline Phosphatase: 61 U/L (ref 38–126)
BUN: 18 mg/dL (ref 6–20)
CO2: 24 mmol/L (ref 22–32)
Calcium: 9.3 mg/dL (ref 8.9–10.3)
Chloride: 105 mmol/L (ref 101–111)
Creatinine, Ser: 1.04 mg/dL (ref 0.61–1.24)
Glucose, Bld: 116 mg/dL — ABNORMAL HIGH (ref 65–99)
POTASSIUM: 3.2 mmol/L — AB (ref 3.5–5.1)
Sodium: 140 mmol/L (ref 135–145)
Total Bilirubin: 0.4 mg/dL (ref 0.3–1.2)
Total Protein: 6.9 g/dL (ref 6.5–8.1)

## 2018-02-24 LAB — RAPID URINE DRUG SCREEN, HOSP PERFORMED
AMPHETAMINES: NOT DETECTED
Barbiturates: NOT DETECTED
Benzodiazepines: NOT DETECTED
COCAINE: POSITIVE — AB
OPIATES: POSITIVE — AB
Tetrahydrocannabinol: POSITIVE — AB

## 2018-02-24 LAB — CBC
HCT: 47.3 % (ref 39.0–52.0)
Hemoglobin: 15.6 g/dL (ref 13.0–17.0)
MCH: 31 pg (ref 26.0–34.0)
MCHC: 33 g/dL (ref 30.0–36.0)
MCV: 93.8 fL (ref 78.0–100.0)
PLATELETS: 226 10*3/uL (ref 150–400)
RBC: 5.04 MIL/uL (ref 4.22–5.81)
RDW: 13.8 % (ref 11.5–15.5)
WBC: 10.7 10*3/uL — AB (ref 4.0–10.5)

## 2018-02-24 LAB — ETHANOL

## 2018-02-24 LAB — SALICYLATE LEVEL: Salicylate Lvl: <7 mg/dL (ref 2.8–30.0)

## 2018-02-24 LAB — ACETAMINOPHEN LEVEL

## 2018-02-24 MED ORDER — CLONIDINE HCL 0.2 MG PO TABS: 0.1000 mg | ORAL_TABLET | Freq: Every day | ORAL | Status: DC

## 2018-02-24 MED ORDER — NAPROXEN 250 MG PO TABS: 500.0000 mg | ORAL_TABLET | Freq: Two times a day (BID) | ORAL | Status: DC | PRN

## 2018-02-24 MED ORDER — POTASSIUM CHLORIDE CRYS ER 20 MEQ PO TBCR
40.0000 meq | EXTENDED_RELEASE_TABLET | Freq: Every day | ORAL | Status: DC
  Administered 2018-02-24: 40 meq via ORAL
  Filled 2018-02-24 (×2): qty 2

## 2018-02-24 MED ORDER — DICYCLOMINE HCL 20 MG PO TABS: 20.0000 mg | ORAL_TABLET | Freq: Four times a day (QID) | ORAL | Status: DC | PRN

## 2018-02-24 MED ORDER — ZONISAMIDE 100 MG PO CAPS
100.0000 mg | ORAL_CAPSULE | Freq: Every day | ORAL | Status: DC
  Administered 2018-02-24: 100 mg via ORAL
  Filled 2018-02-24 (×4): qty 1

## 2018-02-24 MED ORDER — ONDANSETRON 4 MG PO TBDP
4.0000 mg | ORAL_TABLET | Freq: Four times a day (QID) | ORAL | Status: DC | PRN
Start: 2018-02-24 — End: 2018-02-25
  Administered 2018-02-24: 4 mg via ORAL
  Filled 2018-02-24: qty 1

## 2018-02-24 MED ORDER — METHOCARBAMOL 500 MG PO TABS: 500.0000 mg | ORAL_TABLET | Freq: Three times a day (TID) | ORAL | Status: DC | PRN

## 2018-02-24 MED ORDER — ACETAMINOPHEN 325 MG PO TABS
650.0000 mg | ORAL_TABLET | ORAL | Status: DC | PRN
  Administered 2018-02-25: 650 mg via ORAL
  Filled 2018-02-24: qty 2

## 2018-02-24 MED ORDER — ONDANSETRON HCL 4 MG PO TABS: 4.0000 mg | ORAL_TABLET | Freq: Three times a day (TID) | ORAL | Status: DC | PRN

## 2018-02-24 MED ORDER — CLONIDINE HCL 0.2 MG PO TABS
0.1000 mg | ORAL_TABLET | Freq: Four times a day (QID) | ORAL | Status: DC
  Administered 2018-02-24: 0.1 mg via ORAL
  Filled 2018-02-24 (×2): qty 1

## 2018-02-24 MED ORDER — ALUM & MAG HYDROXIDE-SIMETH 200-200-20 MG/5ML PO SUSP: 30.0000 mL | Freq: Four times a day (QID) | ORAL | Status: DC | PRN

## 2018-02-24 MED ORDER — CLONIDINE HCL 0.2 MG PO TABS: 0.1000 mg | ORAL_TABLET | ORAL | Status: DC

## 2018-02-24 MED ORDER — NICOTINE 21 MG/24HR TD PT24
21.0000 mg | MEDICATED_PATCH | Freq: Every day | TRANSDERMAL | Status: DC
  Administered 2018-02-24: 21 mg via TRANSDERMAL
  Filled 2018-02-24 (×2): qty 1

## 2018-02-24 MED ORDER — LOPERAMIDE HCL 2 MG PO CAPS: 2.0000 mg | ORAL_CAPSULE | ORAL | Status: DC | PRN

## 2018-02-24 MED ORDER — HYDROXYZINE HCL 25 MG PO TABS: 25.0000 mg | ORAL_TABLET | Freq: Four times a day (QID) | ORAL | Status: DC | PRN

## 2018-02-24 MED ORDER — NICOTINE 14 MG/24HR TD PT24
14.0000 mg | MEDICATED_PATCH | Freq: Once | TRANSDERMAL | Status: DC
  Administered 2018-02-24: 14 mg via TRANSDERMAL
  Filled 2018-02-24: qty 1

## 2018-02-24 NOTE — ED Notes (Signed)
Dinner tray arrived 

## 2018-02-24 NOTE — BH Assessment (Signed)
Counselor attempted TTS, pt sleeping. RN to call back when pt is awake.

## 2018-02-24 NOTE — ED Notes (Signed)
Staffing notified of need for sitter, pt's belongings given to family.

## 2018-02-24 NOTE — ED Notes (Signed)
Pt/family requesting update on plan. Pt informed that per Joni ReiningNicole, [place] he will be staying.

## 2018-02-24 NOTE — ED Notes (Signed)
Writer called down stairs to main lab for updated on the blood work, lab stated that their system was down and there has been a delayed in results.

## 2018-02-24 NOTE — ED Triage Notes (Signed)
Pt reports suicidal ideation with a plan to overdose on crack/cocaine. Last use yesterday, pt reports that he uses most days of the week and that he is addicted to pain medication. Hx TBI

## 2018-02-24 NOTE — ED Notes (Signed)
Security wanding pt and pt belonging given to family.

## 2018-02-24 NOTE — ED Notes (Signed)
Pt being verbally aggressive with this RN. Pt requesting a new nicotine patch. Pt reminded it was placed at 1453 and is a 24 hour patch. Pt requesting a meal tray. This RN told pt we have [place] sandwiches available. Pt reports he would "rather starve than eat a [place] sandwich."

## 2018-02-24 NOTE — ED Notes (Signed)
This RN confirmed with spouse that she took home all belongings.

## 2018-02-24 NOTE — ED Notes (Signed)
Dinner Tray Ordered @ 1848-per [place], [place]-called by Marylene LandAngela
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
## 2018-02-25 ENCOUNTER — Encounter (HOSPITAL_COMMUNITY): Payer: Self-pay | Admitting: Registered Nurse

## 2018-02-25 DIAGNOSIS — F322 Major depressive disorder, single episode, severe without psychotic features: Secondary | ICD-10-CM | POA: Diagnosis not present

## 2018-02-25 NOTE — ED Provider Notes (Signed)
Patient here with suicidal ideations in the setting of drug use.  Patient was medically cleared at behavioral health attempted to evaluate the patient however he was uncooperative.  I spoke with the patient and he is currently denying any active suicidal ideations.  States he is looking to get help with rehabilitation for drug use.  States that he already has an appointment with a mark on Tuesday.  He does not appear to be a immediate threat to himself or others and feel he is safe for discharge.   Nira Connardama, Pedro Eduardo, MD 02/25/18 1108

## 2018-02-25 NOTE — ED Notes (Signed)
Pt has blanket over his head when TTS attempted-- refusing to participate. States "I have a headache, I don't feel like talking right now" pt raising voice in room-- TTS machine removed, lights turned off.
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
## 2018-02-25 NOTE — ED Notes (Signed)
Pt doing TTS.  

## 2018-02-25 NOTE — ED Notes (Signed)
Security notified of pt's behavior-- [place]
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
## 2018-04-21 ENCOUNTER — Encounter: Payer: Self-pay | Admitting: Diagnostic Neuroimaging

## 2018-04-21 ENCOUNTER — Ambulatory Visit (INDEPENDENT_AMBULATORY_CARE_PROVIDER_SITE_OTHER): Payer: Medicare Other | Admitting: Diagnostic Neuroimaging

## 2018-04-21 VITALS — BP 108/62 | HR 73 | Ht 69.0 in | Wt 181.6 lb

## 2018-04-21 DIAGNOSIS — S069X5S Unspecified intracranial injury with loss of consciousness greater than 24 hours with return to pre-existing conscious level, sequela: Secondary | ICD-10-CM

## 2018-04-21 DIAGNOSIS — G40909 Epilepsy, unspecified, not intractable, without status epilepticus: Secondary | ICD-10-CM | POA: Diagnosis not present

## 2018-04-21 MED ORDER — TOPIRAMATE 50 MG PO TABS: 50.0000 mg | ORAL_TABLET | Freq: Two times a day (BID) | ORAL | 12 refills | Status: DC
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
## 2018-04-28 ENCOUNTER — Other Ambulatory Visit: Payer: Self-pay

## 2018-04-28 ENCOUNTER — Emergency Department (HOSPITAL_COMMUNITY)
Admission: EM | Admit: 2018-04-28 | Discharge: 2018-04-28 | Disposition: A | Discharge status: Home / Self Care | Payer: Medicare Other | Attending: Emergency Medicine | Admitting: Emergency Medicine

## 2018-04-28 DIAGNOSIS — F17210 Nicotine dependence, cigarettes, uncomplicated: Secondary | ICD-10-CM | POA: Insufficient documentation

## 2018-04-28 DIAGNOSIS — Z79899 Other long term (current) drug therapy: Secondary | ICD-10-CM | POA: Insufficient documentation

## 2018-04-28 DIAGNOSIS — G44329 Chronic post-traumatic headache, not intractable: Secondary | ICD-10-CM | POA: Insufficient documentation

## 2018-04-28 DIAGNOSIS — T402X1A Poisoning by other opioids, accidental (unintentional), initial encounter: Secondary | ICD-10-CM | POA: Insufficient documentation

## 2018-04-28 DIAGNOSIS — Z87820 Personal history of traumatic brain injury: Secondary | ICD-10-CM | POA: Insufficient documentation

## 2018-04-28 DIAGNOSIS — T40604A Poisoning by unspecified narcotics, undetermined, initial encounter: Secondary | ICD-10-CM

## 2018-04-28 LAB — CBC
HCT: 47.7 % (ref 39.0–52.0)
Hemoglobin: 16 g/dL (ref 13.0–17.0)
MCH: 30.9 pg (ref 26.0–34.0)
MCHC: 33.5 g/dL (ref 30.0–36.0)
MCV: 92.3 fL (ref 78.0–100.0)
PLATELETS: 237 10*3/uL (ref 150–400)
RBC: 5.17 MIL/uL (ref 4.22–5.81)
RDW: 13.2 % (ref 11.5–15.5)
WBC: 10.7 10*3/uL — ABNORMAL HIGH (ref 4.0–10.5)

## 2018-04-28 LAB — COMPREHENSIVE METABOLIC PANEL
ALK PHOS: 66 U/L (ref 38–126)
ALT: 33 U/L (ref 17–63)
AST: 23 U/L (ref 15–41)
Albumin: 4.1 g/dL (ref 3.5–5.0)
Anion gap: 9 (ref 5–15)
BILIRUBIN TOTAL: 0.8 mg/dL (ref 0.3–1.2)
BUN: 15 mg/dL (ref 6–20)
CALCIUM: 9.7 mg/dL (ref 8.9–10.3)
CHLORIDE: 101 mmol/L (ref 101–111)
CO2: 31 mmol/L (ref 22–32)
Creatinine, Ser: 1.16 mg/dL (ref 0.61–1.24)
Glucose, Bld: 116 mg/dL — ABNORMAL HIGH (ref 65–99)
Potassium: 3.8 mmol/L (ref 3.5–5.1)
Sodium: 141 mmol/L (ref 135–145)
Total Protein: 7.7 g/dL (ref 6.5–8.1)

## 2018-04-28 LAB — ETHANOL

## 2018-04-28 LAB — SALICYLATE LEVEL: Salicylate Lvl: <7 mg/dL (ref 2.8–30.0)

## 2018-04-28 LAB — ACETAMINOPHEN LEVEL: Acetaminophen (Tylenol), Serum: <10 ug/mL — ABNORMAL LOW (ref 10–30)

## 2018-04-28 NOTE — ED Notes (Addendum)
Pt states he has not eaten in 4 days due to depression and doing cocaine. Pt denies thoughts of suicide at this time.
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
## 2018-04-28 NOTE — ED Triage Notes (Addendum)
Patient states that he took (20) 30 mg tablets of morphine that he got at his friends house from his friend's mother that passes away. States that he took all of that medication due to bad with a headache. States that his "friend" gave him Narcan in his intranasal.

## 2018-05-05 ENCOUNTER — Emergency Department (HOSPITAL_COMMUNITY)
Admission: EM | Admit: 2018-05-05 | Discharge: 2018-05-07 | Disposition: A | Discharge status: Other Acute Inpatient Hospital | Payer: Medicare Other | Attending: Emergency Medicine | Admitting: Emergency Medicine

## 2018-05-05 ENCOUNTER — Encounter (HOSPITAL_COMMUNITY): Payer: Self-pay | Admitting: Emergency Medicine

## 2018-05-05 DIAGNOSIS — R45851 Suicidal ideations: Secondary | ICD-10-CM | POA: Insufficient documentation

## 2018-05-05 DIAGNOSIS — F17210 Nicotine dependence, cigarettes, uncomplicated: Secondary | ICD-10-CM | POA: Insufficient documentation

## 2018-05-05 DIAGNOSIS — F332 Major depressive disorder, recurrent severe without psychotic features: Secondary | ICD-10-CM | POA: Insufficient documentation

## 2018-05-05 LAB — CBC WITH DIFFERENTIAL/PLATELET
Basophils Absolute: 0 10*3/uL (ref 0.0–0.1)
Basophils Relative: 0 %
EOS ABS: 0.1 10*3/uL (ref 0.0–0.7)
EOS PCT: 1 %
HCT: 46.5 % (ref 39.0–52.0)
Hemoglobin: 15.4 g/dL (ref 13.0–17.0)
LYMPHS ABS: 1.6 10*3/uL (ref 0.7–4.0)
Lymphocytes Relative: 21 %
MCH: 31.2 pg (ref 26.0–34.0)
MCHC: 33.1 g/dL (ref 30.0–36.0)
MCV: 94.3 fL (ref 78.0–100.0)
MONOS PCT: 16 %
Monocytes Absolute: 1.2 10*3/uL — ABNORMAL HIGH (ref 0.1–1.0)
Neutro Abs: 4.8 10*3/uL (ref 1.7–7.7)
Neutrophils Relative %: 62 %
PLATELETS: 192 10*3/uL (ref 150–400)
RBC: 4.93 MIL/uL (ref 4.22–5.81)
RDW: 13.9 % (ref 11.5–15.5)
WBC: 7.7 10*3/uL (ref 4.0–10.5)

## 2018-05-05 LAB — RAPID URINE DRUG SCREEN, HOSP PERFORMED
AMPHETAMINES: NOT DETECTED
Barbiturates: NOT DETECTED
Benzodiazepines: NOT DETECTED
Cocaine: NOT DETECTED
OPIATES: POSITIVE — AB
TETRAHYDROCANNABINOL: NOT DETECTED

## 2018-05-05 LAB — CBG MONITORING, ED: Glucose-Capillary: 95 mg/dL (ref 65–99)

## 2018-05-05 LAB — COMPREHENSIVE METABOLIC PANEL
ALBUMIN: 4.3 g/dL (ref 3.5–5.0)
ALK PHOS: 61 U/L (ref 38–126)
ALT: 36 U/L (ref 17–63)
ANION GAP: 6 (ref 5–15)
AST: 21 U/L (ref 15–41)
BILIRUBIN TOTAL: 0.9 mg/dL (ref 0.3–1.2)
BUN: 16 mg/dL (ref 6–20)
CALCIUM: 9.4 mg/dL (ref 8.9–10.3)
CO2: 25 mmol/L (ref 22–32)
Chloride: 108 mmol/L (ref 101–111)
Creatinine, Ser: 1.06 mg/dL (ref 0.61–1.24)
GFR calc Af Amer: >60 mL/min (ref >60)
GFR calc non Af Amer: >60 mL/min (ref >60)
GLUCOSE: 106 mg/dL — AB (ref 65–99)
Potassium: 3.9 mmol/L (ref 3.5–5.1)
Sodium: 139 mmol/L (ref 135–145)
TOTAL PROTEIN: 7.6 g/dL (ref 6.5–8.1)

## 2018-05-05 LAB — SALICYLATE LEVEL: Salicylate Lvl: <7 mg/dL (ref 2.8–30.0)

## 2018-05-05 LAB — ACETAMINOPHEN LEVEL: Acetaminophen (Tylenol), Serum: <10 ug/mL — ABNORMAL LOW (ref 10–30)

## 2018-05-05 LAB — ETHANOL

## 2018-05-05 MED ORDER — TOPIRAMATE 25 MG PO TABS: 50.0000 mg | ORAL_TABLET | Freq: Two times a day (BID) | ORAL | Status: DC

## 2018-05-05 MED ORDER — NICOTINE 21 MG/24HR TD PT24
21.0000 mg | MEDICATED_PATCH | Freq: Once | TRANSDERMAL | Status: AC
  Administered 2018-05-05: 21 mg via TRANSDERMAL
  Filled 2018-05-05: qty 1

## 2018-05-05 NOTE — ED Triage Notes (Signed)
Patient here from home via EMS with complaints of overdose. States that he took 25 tablets of Topiramate. Denies SI, states "my head was hurting and I was trying to relieve it".
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
## 2018-05-05 NOTE — ED Notes (Signed)
Pt stated "My mother had me put here because I took too much medicine trying to get my h/a to stop."  Pt was unable to state the name of the provider that writes for his Topamax.

## 2018-05-05 NOTE — Progress Notes (Signed)
05/05/18  1835  Poison Control called and is closing patients case. Per PC if we need further assistance please call them back. Reviewed vital signs and EKG with PC.
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
## 2018-05-05 NOTE — ED Notes (Signed)
Pt called spouse, became angry & yelling on the phone stating "I don't belong here.  This is bullsh--."  Informed pt will be seen by psychiatry in the a.m.  Pt requesting nicotine patch.

## 2018-05-05 NOTE — BHH Counselor (Signed)
Patient signed release of info form for his wife, Barbette MerinoDeedra Agerton.   Evette Cristalaroline Paige Deidrick Rainey, [place] Therapeutic Triage Specialist

## 2018-05-05 NOTE — ED Notes (Signed)
Dr. Ranae PalmsYelverton informed pt needs med holding orders & pt's request for nicotine patch.  Pt informed EDP notified.

## 2018-05-05 NOTE — ED Notes (Addendum)
A male caller stating "I'm his wife.  I had him committed.  I just need to know if he's up and moving around."  Informed cannot provide pt information but if she would like to leave a #.  Anselm JunglingDeedra, spouse [phone]

## 2018-05-06 DIAGNOSIS — F332 Major depressive disorder, recurrent severe without psychotic features: Secondary | ICD-10-CM

## 2018-05-06 MED ORDER — TRAZODONE HCL 100 MG PO TABS: 100.0000 mg | ORAL_TABLET | Freq: Every evening | ORAL | Status: DC | PRN

## 2018-05-06 MED ORDER — DIPHENHYDRAMINE HCL 50 MG/ML IJ SOLN
50.0000 mg | Freq: Once | INTRAMUSCULAR | Status: AC
  Administered 2018-05-06: 50 mg via INTRAMUSCULAR
  Filled 2018-05-06: qty 1

## 2018-05-06 MED ORDER — ZIPRASIDONE MESYLATE 20 MG IM SOLR
20.0000 mg | Freq: Two times a day (BID) | INTRAMUSCULAR | Status: DC | PRN
  Administered 2018-05-06: 20 mg via INTRAMUSCULAR
  Filled 2018-05-06: qty 20

## 2018-05-06 MED ORDER — RISPERIDONE 0.5 MG PO TABS
0.5000 mg | ORAL_TABLET | Freq: Two times a day (BID) | ORAL | Status: DC
  Administered 2018-05-07: 0.5 mg via ORAL
  Filled 2018-05-06 (×2): qty 1

## 2018-05-06 MED ORDER — STERILE WATER FOR INJECTION IJ SOLN
INTRAMUSCULAR | Status: AC
  Administered 2018-05-06: 10 mL
  Filled 2018-05-06: qty 10

## 2018-05-06 MED ORDER — LORAZEPAM 2 MG/ML IJ SOLN
2.0000 mg | Freq: Two times a day (BID) | INTRAMUSCULAR | Status: DC | PRN
  Administered 2018-05-06: 2 mg via INTRAMUSCULAR
  Filled 2018-05-06: qty 1
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
## 2018-05-06 NOTE — BH Assessment (Signed)
[facility]  Per Juanetta BeetsJacqueline Norman, DO, this pt requires psychiatric hospitalization.  Malva LimesLinsey Strader, RN, [facility]m [id number]; [facility] will be ready to receive pt at 13:00.  Pt presents under IVC initiated by Barbette Merinoeedra Shimer, and upheld by Duffy BruceEDPJason Mesner, MD.  Pt's nurse has been notified, and agrees to call report to [phone].  Pt is to be transported via [employer].   Doylene Canninghomas Armen Waring, [place] Behavioral Health Coordinator [phone]

## 2018-05-06 NOTE — ED Notes (Signed)
Patient currently sleeping with no distress noted. Respirations regular and unlabored. Sitter at bedside for safety.

## 2018-05-06 NOTE — Progress Notes (Signed)
[facility]  Advised to call back in 20 minutes.

## 2018-05-06 NOTE — ED Notes (Signed)
[facility] with Bunnie Pionori[facility] remains asleep and [facility].

## 2018-05-06 NOTE — Progress Notes (Signed)
[facility] and gave report on patient.  [facility].  Wants us to keep patient here in the TCU for a couple of hours and monitor his behavior on the Geodon he received earlier.  Advised that I would monitor patient and report back in a couple of hours.

## 2018-05-07 ENCOUNTER — Encounter (HOSPITAL_COMMUNITY): Payer: Self-pay

## 2018-05-07 ENCOUNTER — Other Ambulatory Visit: Payer: Self-pay

## 2018-05-07 ENCOUNTER — Inpatient Hospital Stay (HOSPITAL_COMMUNITY)
Admission: AD | Admit: 2018-05-07 | Discharge: 2018-05-11 | DRG: 885 | Disposition: A | Discharge status: Home / Self Care | Payer: Medicare Other | Source: Intra-hospital | Attending: Psychiatry | Admitting: Psychiatry

## 2018-05-07 DIAGNOSIS — F419 Anxiety disorder, unspecified: Secondary | ICD-10-CM | POA: Diagnosis present

## 2018-05-07 DIAGNOSIS — F1410 Cocaine abuse, uncomplicated: Secondary | ICD-10-CM | POA: Diagnosis not present

## 2018-05-07 DIAGNOSIS — F1124 Opioid dependence with opioid-induced mood disorder: Secondary | ICD-10-CM | POA: Diagnosis not present

## 2018-05-07 DIAGNOSIS — Z8349 Family history of other endocrine, nutritional and metabolic diseases: Secondary | ICD-10-CM

## 2018-05-07 DIAGNOSIS — Z91018 Allergy to other foods: Secondary | ICD-10-CM

## 2018-05-07 DIAGNOSIS — Z62810 Personal history of physical and sexual abuse in childhood: Secondary | ICD-10-CM

## 2018-05-07 DIAGNOSIS — F17210 Nicotine dependence, cigarettes, uncomplicated: Secondary | ICD-10-CM | POA: Diagnosis present

## 2018-05-07 DIAGNOSIS — Z880 Allergy status to penicillin: Secondary | ICD-10-CM

## 2018-05-07 DIAGNOSIS — Z87820 Personal history of traumatic brain injury: Secondary | ICD-10-CM

## 2018-05-07 DIAGNOSIS — Z800 Family history of malignant neoplasm of digestive organs: Secondary | ICD-10-CM | POA: Diagnosis not present

## 2018-05-07 DIAGNOSIS — Z888 Allergy status to other drugs, medicaments and biological substances status: Secondary | ICD-10-CM | POA: Diagnosis not present

## 2018-05-07 DIAGNOSIS — Z62811 Personal history of psychological abuse in childhood: Secondary | ICD-10-CM

## 2018-05-07 DIAGNOSIS — F332 Major depressive disorder, recurrent severe without psychotic features: Secondary | ICD-10-CM | POA: Diagnosis not present

## 2018-05-07 DIAGNOSIS — G40909 Epilepsy, unspecified, not intractable, without status epilepticus: Secondary | ICD-10-CM | POA: Diagnosis present

## 2018-05-07 DIAGNOSIS — Z630 Problems in relationship with spouse or partner: Secondary | ICD-10-CM | POA: Diagnosis not present

## 2018-05-07 DIAGNOSIS — Z818 Family history of other mental and behavioral disorders: Secondary | ICD-10-CM

## 2018-05-07 DIAGNOSIS — F1120 Opioid dependence, uncomplicated: Secondary | ICD-10-CM | POA: Diagnosis present

## 2018-05-07 DIAGNOSIS — Z8249 Family history of ischemic heart disease and other diseases of the circulatory system: Secondary | ICD-10-CM

## 2018-05-07 DIAGNOSIS — F1110 Opioid abuse, uncomplicated: Secondary | ICD-10-CM | POA: Diagnosis not present

## 2018-05-07 DIAGNOSIS — Z915 Personal history of self-harm: Secondary | ICD-10-CM

## 2018-05-07 DIAGNOSIS — Z79899 Other long term (current) drug therapy: Secondary | ICD-10-CM | POA: Diagnosis not present

## 2018-05-07 DIAGNOSIS — G4700 Insomnia, unspecified: Secondary | ICD-10-CM | POA: Diagnosis not present

## 2018-05-07 MED ORDER — LOPERAMIDE HCL 2 MG PO CAPS
2.0000 mg | ORAL_CAPSULE | ORAL | Status: DC | PRN
Start: 2018-05-07 — End: 2018-05-09
  Administered 2018-05-07 – 2018-05-08 (×2): 2 mg via ORAL
  Filled 2018-05-07 (×2): qty 1

## 2018-05-07 MED ORDER — CLONIDINE HCL 0.1 MG PO TABS
0.1000 mg | ORAL_TABLET | ORAL | Status: DC
  Filled 2018-05-07 (×4): qty 1

## 2018-05-07 MED ORDER — DICYCLOMINE HCL 20 MG PO TABS: 20.0000 mg | ORAL_TABLET | Freq: Four times a day (QID) | ORAL | Status: DC | PRN

## 2018-05-07 MED ORDER — METHOCARBAMOL 500 MG PO TABS: 500.0000 mg | ORAL_TABLET | Freq: Three times a day (TID) | ORAL | Status: DC | PRN

## 2018-05-07 MED ORDER — NICOTINE 21 MG/24HR TD PT24
21.0000 mg | MEDICATED_PATCH | Freq: Once | TRANSDERMAL | Status: DC
  Administered 2018-05-07: 21 mg via TRANSDERMAL
  Filled 2018-05-07: qty 1

## 2018-05-07 MED ORDER — NAPROXEN 500 MG PO TABS
500.0000 mg | ORAL_TABLET | Freq: Two times a day (BID) | ORAL | Status: DC | PRN
  Administered 2018-05-07: 500 mg via ORAL
  Filled 2018-05-07: qty 1

## 2018-05-07 MED ORDER — ACETAMINOPHEN 325 MG PO TABS
650.0000 mg | ORAL_TABLET | Freq: Four times a day (QID) | ORAL | Status: DC | PRN
  Administered 2018-05-07 – 2018-05-10 (×2): 650 mg via ORAL
  Filled 2018-05-07 (×2): qty 2

## 2018-05-07 MED ORDER — ONDANSETRON 4 MG PO TBDP: 4.0000 mg | ORAL_TABLET | Freq: Four times a day (QID) | ORAL | Status: DC | PRN

## 2018-05-07 MED ORDER — NAPROXEN 500 MG PO TABS: 500.0000 mg | ORAL_TABLET | Freq: Two times a day (BID) | ORAL | Status: DC | PRN

## 2018-05-07 MED ORDER — CLONIDINE HCL 0.1 MG PO TABS
0.1000 mg | ORAL_TABLET | Freq: Four times a day (QID) | ORAL | Status: DC
  Administered 2018-05-07: 0.1 mg via ORAL

## 2018-05-07 MED ORDER — CLONIDINE HCL 0.1 MG PO TABS: 0.1000 mg | ORAL_TABLET | ORAL | Status: DC

## 2018-05-07 MED ORDER — NICOTINE POLACRILEX 2 MG MT GUM
2.0000 mg | CHEWING_GUM | OROMUCOSAL | Status: DC | PRN
  Administered 2018-05-07 – 2018-05-11 (×20): 2 mg via ORAL
  Filled 2018-05-07 (×5): qty 1
  Filled 2018-05-07: qty 10
  Filled 2018-05-07: qty 1
  Filled 2018-05-07: qty 13

## 2018-05-07 MED ORDER — MAGNESIUM HYDROXIDE 400 MG/5ML PO SUSP: 30.0000 mL | Freq: Every day | ORAL | Status: DC | PRN

## 2018-05-07 MED ORDER — LOPERAMIDE HCL 2 MG PO CAPS
2.0000 mg | ORAL_CAPSULE | ORAL | Status: DC | PRN
Start: 2018-05-07 — End: 2018-05-07

## 2018-05-07 MED ORDER — SERTRALINE HCL 25 MG PO TABS
25.0000 mg | ORAL_TABLET | Freq: Every day | ORAL | Status: DC
Start: 2018-05-08 — End: 2018-05-10
  Administered 2018-05-08 – 2018-05-10 (×3): 25 mg via ORAL
  Filled 2018-05-07 (×5): qty 1

## 2018-05-07 MED ORDER — CLONIDINE HCL 0.1 MG PO TABS
0.1000 mg | ORAL_TABLET | Freq: Every day | ORAL | Status: DC
  Filled 2018-05-07: qty 1

## 2018-05-07 MED ORDER — ZIPRASIDONE MESYLATE 20 MG IM SOLR: 20.0000 mg | Freq: Two times a day (BID) | INTRAMUSCULAR | Status: DC | PRN

## 2018-05-07 MED ORDER — CLONIDINE HCL 0.1 MG PO TABS
0.1000 mg | ORAL_TABLET | Freq: Once | ORAL | Status: DC
  Filled 2018-05-07: qty 1

## 2018-05-07 MED ORDER — HYDROXYZINE HCL 25 MG PO TABS: 25.0000 mg | ORAL_TABLET | Freq: Four times a day (QID) | ORAL | Status: DC | PRN

## 2018-05-07 MED ORDER — CLONIDINE HCL 0.1 MG PO TABS
0.1000 mg | ORAL_TABLET | Freq: Four times a day (QID) | ORAL | Status: AC
  Administered 2018-05-07 – 2018-05-09 (×5): 0.1 mg via ORAL
  Filled 2018-05-07 (×9): qty 1

## 2018-05-07 MED ORDER — CLONIDINE HCL 0.1 MG PO TABS: 0.1000 mg | ORAL_TABLET | Freq: Every day | ORAL | Status: DC

## 2018-05-07 MED ORDER — TOPIRAMATE 25 MG PO TABS
25.0000 mg | ORAL_TABLET | Freq: Every day | ORAL | Status: DC
Start: 2018-05-08 — End: 2018-05-11
  Administered 2018-05-08 – 2018-05-10 (×3): 25 mg via ORAL
  Filled 2018-05-07 (×5): qty 1

## 2018-05-07 MED ORDER — LORAZEPAM 2 MG/ML IJ SOLN: 2.0000 mg | Freq: Two times a day (BID) | INTRAMUSCULAR | Status: DC | PRN

## 2018-05-07 MED ORDER — TRAZODONE HCL 100 MG PO TABS: 100.0000 mg | ORAL_TABLET | Freq: Every evening | ORAL | Status: DC | PRN

## 2018-05-07 MED ORDER — TRAZODONE HCL 50 MG PO TABS
50.0000 mg | ORAL_TABLET | Freq: Every evening | ORAL | Status: DC | PRN
  Administered 2018-05-08 – 2018-05-10 (×4): 50 mg via ORAL
  Filled 2018-05-07 (×4): qty 1
  Filled 2018-05-07: qty 7

## 2018-05-07 MED ORDER — RISPERIDONE 0.5 MG PO TABS
0.5000 mg | ORAL_TABLET | Freq: Two times a day (BID) | ORAL | Status: DC
  Administered 2018-05-07: 0.5 mg via ORAL
  Filled 2018-05-07 (×4): qty 1

## 2018-05-07 MED ORDER — ALUM & MAG HYDROXIDE-SIMETH 200-200-20 MG/5ML PO SUSP: 30.0000 mL | ORAL | Status: DC | PRN

## 2018-05-07 NOTE — BHH Group Notes (Signed)
LCSW Group Therapy Note  05/07/2018 1:15pm  Type of Therapy/Topic:  Group Therapy:  Feelings about Diagnosis  Participation Level:  Did Not Attend--pt invited. Chose to remain in bed.    Description of Group:   This group will allow patients to explore their thoughts and feelings about diagnoses they have received. Patients will be guided to explore their level of understanding and acceptance of these diagnoses. Facilitator will encourage patients to process their thoughts and feelings about the reactions of others to their diagnosis and will guide patients in identifying ways to discuss their diagnosis with significant others in their lives. This group will be process-oriented, with patients participating in exploration of their own experiences, giving and receiving support, and processing challenge from other group members.   Therapeutic Goals: 1. Patient will demonstrate understanding of diagnosis as evidenced by identifying two or more symptoms of the disorder 2. Patient will be able to express two feelings regarding the diagnosis 3. Patient will demonstrate their ability to communicate their needs through discussion and/or role play  Summary of Patient Progress:  x     Therapeutic Modalities:   Cognitive Behavioral Therapy Brief Therapy Feelings Identification    Rona RavensHeather S Remona Boom, LCSW 05/07/2018 11:42 AM

## 2018-05-07 NOTE — Plan of Care (Signed)
Problem: Safety: Goal: Periods of time without injury will increase Intervention: Patient contracts for safety on the unit. High fall risk precautions in place. Safety monitored with q15 minute checks. Outcome: Patient remains safe on the unit at this time. 05/07/2018 4:38 PM - Progressing by Ferrel Loganollazo, Janayia Burggraf A, RN

## 2018-05-07 NOTE — Tx Team (Signed)
Initial Treatment Plan 05/07/2018 4:29 PM Arnold LongJacob Haglund ZOX:[id number]    PATIENT STRESSORS: Marital or family conflict   PATIENT STRENGTHS: Communication skills General fund of knowledge   PATIENT IDENTIFIED PROBLEMS: "I don't know why I am here"                     DISCHARGE CRITERIA:  Improved stabilization in mood, thinking, and/or behavior  PRELIMINARY DISCHARGE PLAN: Return to previous living arrangement  PATIENT/FAMILY INVOLVEMENT: This treatment plan has been presented to and reviewed with the patient, Arnold LongJacob [place], and/or family member.  The patient and family have been given the opportunity to ask questions and make suggestions.  Jerrye BushyLaRonica R Briyanna Billingham, RN 05/07/2018, 4:29 PM

## 2018-05-07 NOTE — Progress Notes (Signed)
Patient ID: Ryan Blackburn, male   DOB: 03/24/1981, 37 y.o.   MRN: [id number] Patient admitted to the unit after an intention overdose of topamax.  Patient stated that he was not intending to commit suicide but he had pain that was too unbearable and took too many of his prescribed topamax.  Patient currently denies SI, Hi and avh and states that he is often depressed though he is the one who attempts to make everyone happy and laugh.  Patient talked about how difficult it was to be a parent to 2 disabled children when he himself is disabled.    Skin assessment complete and patient was found to be free of all injury though he was noted to have several tattoos. Patient was free of all contraband.    Patient admitted and oriented to the unit without incident.

## 2018-05-07 NOTE — Progress Notes (Signed)
Patient ID: Ryan LongJacob Blackburn, male   DOB: 08/03/1981, 37 y.o.   MRN: [id number] PER STATE REGULATIONS 482.30  THIS CHART WAS REVIEWED FOR MEDICAL NECESSITY WITH RESPECT TO THE PATIENT'S ADMISSION/DURATION OF STAY.  NEXT REVIEW DATE:05/11/18  Loura HaltBARBARA Kaysi Ourada, RN, BSN CASE MANAGER

## 2018-05-07 NOTE — Progress Notes (Signed)
Patient did not attend wrap up group. 

## 2018-05-07 NOTE — Progress Notes (Signed)
Patient ID: Ryan LongJacob Salmela, male   DOB: 08/31/1981, 37 y.o.   MRN: [id number]  Nursing Progress Note [id number]  Data: Report received from admitting RN. On initial approach, patient observed resting in bed with complaints of withdrawal symptoms. 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 Patient states, "I hate my mom so much for bringing me here. I just want to leave". Patient appears to have poor insight and does not appear vested in hospitalization. Patient has been pleasant and cooperative. Patient requests use of PRN medications for his withdrawal symptoms. Patient is currently seen up in the dayroom interacting with peers. Patient is noted to have poor hygiene and is encouraged to bathe. Patient currently denies SI/HI/AVH. Patient is ambulating with a walker per provider order. Patient reports he has been using a cane at home since 2006.  Action: Patient educated about and provided medication per provider's orders. Patient safety maintained with q15 min safety checks and frequent rounding. High fall risk precautions in place. Emotional support given. 1:1 interaction and active listening provided. Patient encouraged to attend meals and groups. Patient encouraged to work on treatment plan and goals. Labs, vital signs and patient behavior monitored throughout shift.   Response: Patient agrees to come to staff if any thoughts of SI/HI develop or if patient develops intention of acting on thoughts. Patient remains safe on the unit at this time. Patient is interacting with peers appropriately on the unit. Will continue to support and monitor.
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
## 2018-05-07 NOTE — [facility] Suicide Risk Assessment (Signed)
[facility]   Nursing information obtained from:  Patient Demographic factors:  Male, Caucasian Current Mental Status:  NA Loss Factors:  NA Historical Factors:  Prior suicide attempts Risk Reduction Factors:  Responsible for children under 36 years of age, Living with another person, especially a relative  Total Time spent with patient: 45 minutes Principal Problem: Opiate dependence , opiate induced mood disorder , history of seizure disorder  Diagnosis:   Patient Active Problem List   Diagnosis Date Noted  . Major depressive disorder, recurrent severe without psychotic features (HCC) [F33.2] 05/06/2018  . Chronic headaches [R51] 10/01/2015  . Seizure disorder (HCC) [G40.909] 06/16/2014  . TBI (traumatic brain injury) [facility](HCC) [S06.9X9A] 06/16/2014   Subjective Data:   Continued Clinical Symptoms:  Alcohol Use Disorder Identification Test Final Score (AUDIT): 0 The "Alcohol Use Disorders Identification Test", Guidelines for Use in Primary Care, Second Edition.  World Science writerHealth Organization [facility](WHO). Score between 0-7:  no or low risk or alcohol related problems. Score between 8-15:  moderate risk of alcohol related problems. Score between 16-19:  high risk of alcohol related problems. Score 20 or above:  warrants further diagnostic evaluation for alcohol dependence and treatment.   CLINICAL FACTORS:  37 year old married male, admitted under commitment generated by mother.  Presented following overdose on topiramate.  Patient states overdose was accidental, not suicidal intent and that he was trying to address a severe headache.  He endorses opiate use disorder and has been using opiate analgesics daily.  Recent accidental morphine overdose on June 4.  Patient has a history of TBI and seizure disorder, last seizure close to a year ago. He  had recently been started on Topamax  by his outpatient neurologist, states he had taken it only 2 to 3 days prior to  admission.   Psychiatric Specialty Exam: Physical Exam  ROS  Blood pressure 127/76, pulse (!) 115, temperature 97.8 F (36.6 C), temperature source Oral, resp. rate 18, height 5\' 9"  (1.753 m), weight 76.2 kg (168 lb), SpO2 99 %.Body mass index is 24.81 kg/m.  See admit note MSE   COGNITIVE FEATURES THAT CONTRIBUTE TO RISK:  Closed-mindedness and Loss of executive function    SUICIDE RISK:   Moderate:  Frequent suicidal ideation with limited intensity, and duration, some specificity in terms of plans, no associated intent, good self-control, limited dysphoria/symptomatology, some risk factors present, and identifiable protective factors, including available and accessible social support.  PLAN OF CARE: Patient will be admitted to inpatient psychiatric unit for stabilization and safety. Will provide and encourage milieu participation. Provide medication management and maked adjustments as needed.  We will also provide medication management to minimize symptoms of withdrawal- will follow daily.    I certify that inpatient services furnished can reasonably be expected to improve the patient's condition.   Craige CottaFernando A Mily Malecki, MD 05/07/2018, 5:38 PM

## 2018-05-08 DIAGNOSIS — Z630 Problems in relationship with spouse or partner: Secondary | ICD-10-CM

## 2018-05-08 DIAGNOSIS — F1410 Cocaine abuse, uncomplicated: Secondary | ICD-10-CM

## 2018-05-08 DIAGNOSIS — F1110 Opioid abuse, uncomplicated: Secondary | ICD-10-CM

## 2018-05-08 DIAGNOSIS — G4700 Insomnia, unspecified: Secondary | ICD-10-CM

## 2018-05-08 NOTE — [facility] Group Notes (Signed)
LCSW Group Therapy Note   05/08/2018 1:15pm   Type of Therapy and Topic:  Group Therapy:  Overcoming Obstacles   Participation Level:  Did Not Attend--pt invited. Chose to remain in bed.    Description of Group:    In this group patients will be encouraged to explore what they see as obstacles to their own wellness and recovery. They will be guided to discuss their thoughts, feelings, and behaviors related to these obstacles. The group will process together ways to cope with barriers, with attention given to specific choices patients can make. Each patient will be challenged to identify changes they are motivated to make in order to overcome their obstacles. This group will be process-oriented, with patients participating in exploration of their own experiences as well as giving and receiving support and challenge from other group members.   Therapeutic Goals: 1. Patient will identify personal and current obstacles as they relate to admission. 2. Patient will identify barriers that currently interfere with their wellness or overcoming obstacles.  3. Patient will identify feelings, thought process and behaviors related to these barriers. 4. Patient will identify two changes they are willing to make to overcome these obstacles:      Summary of Patient Progress   x   Therapeutic Modalities:   Cognitive Behavioral Therapy Solution Focused Therapy Motivational Interviewing Relapse Prevention Therapy  Greggory Safranek S Ailen Strauch, LCSW 05/08/2018 1:58 PM  

## 2018-05-08 NOTE — Progress Notes (Addendum)
Patient ID: Ryan LongJacob Blackburn, male   DOB: 07/16/1981, 37 y.o.   MRN: [id number]  Pt seen lying in bed with eyes closed. Pt notified of medication availability. Pt refuses Clonidine and to attend lunch. In no current distress. Will continue to monitor.   Update: 3:09 PM Pt reports to writer that today before lunch his wife called to tell him she was sleeping with his friend. Pt reports that during lunchtime he was upset and crying and did not want anyone to know. Pt also wants her removed off of his consent to information list.

## 2018-05-08 NOTE — Progress Notes (Signed)
Recreation Therapy Notes  Date: 6.14.19 Time: 0930 Location: [address] Dayroom  Group Topic: Stress Management  Goal Area(s) Addresses:  Patient will verbalize importance of using healthy stress management.  Patient will identify positive emotions associated with healthy stress management.   Behavioral Response: Engaged  Intervention: Stress Management  Activity :  Progressive Muscle Relaxation.  LRT introduced the stress management technique of progressive muscle relaxation.  LRT read a script to guide patients in doing the activity.  Patients were to follow along as script was read to engage in activity.  Education:  Stress Management, Discharge Planning.   Education Outcome: Acknowledges edcuation/In group clarification offered/Needs additional education  Clinical Observations/Feedback: Pt attended and participated in group.      Caroll RancherMarjette Reanne Nellums, LRT/CTRS         Lillia AbedLindsay, Shuayb Schepers A 05/08/2018 11:15 AM

## 2018-05-08 NOTE — Progress Notes (Signed)
Patient ID: Ryan Blackburn, male   DOB: 11/19/1981, 37 y.o.   MRN: [id number]   Pt currently presents with a depressed affect and anxious behavior. Pt often looks to staff for ongoing verbal reassurance. Reports ongoing worry. Pt states goal is to "go home to my two red headed sons who need their dad." Pt reports he normally takes his Topamax at bedtime.   Pt provided with medications per providers orders. Pt's labs and vitals were monitored throughout the night. Pt given a 1:1 about emotional and mental status. Pt supported and encouraged to express concerns and questions. Pt educated on medications.   Pt's safety ensured with 15 minute and environmental checks. Pt currently denies SI/HI and A/V hallucinations. Pt verbally agrees to seek staff if SI/HI or A/VH occurs and to consult with staff before acting on any harmful thoughts. Will continue POC.

## 2018-05-08 NOTE — Plan of Care (Signed)
Patient verbalizes understanding of information, education provided. 
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
## 2018-05-08 NOTE — Progress Notes (Signed)
Psychoeducational Group Note  Date:  05/08/2018 Time:  2323  Group Topic/Focus:  Wrap-Up Group:   The focus of this group is to help patients review their daily goal of treatment and discuss progress on daily workbooks.  Participation Level: Did Not Attend  Participation Quality:  Not Applicable  Affect:  Not Applicable  Cognitive:  Not Applicable  Insight:  Not Applicable  Engagement in Group: Not Applicable  Additional Comments:  The patient politely refused to attend group and remained in his room to talk to his roommate.   Hazle CocaGOODMAN, Kailany Dinunzio S 05/08/2018, 11:23 PM

## 2018-05-08 NOTE — Progress Notes (Signed)
D: Patient denies SI, HI or AVH this evening. Patient initially irritable found laying in his bed but shortly after was up on the unit for medication at which time he was noted to be anxious but pleasant and cooperative.  He reported a headache for which he was treated with relief of symptoms and has also been treated for diarrhea r/t withdrawal symptoms.  Pt. did not attend evening wrap up group.  A: Patient given emotional support from RN. Patient encouraged to come to staff with concerns and/or questions. Patient's medication routine continued. Patient's orders and plan of care reviewed.   R: Patient remains appropriate and cooperative. Will continue to monitor patient q15 minutes for safety.
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
## 2018-05-08 NOTE — Tx Team (Signed)
Interdisciplinary Treatment and Diagnostic Plan Update  05/08/2018 Time of Session: 0830AM Ryan Blackburn MRN: [id number]  Principal Diagnosis: MDD, recurrent, severe, without psychotic features  Secondary Diagnoses: Active Problems:   Major depressive disorder, recurrent severe without psychotic features (Ryan Blackburn)   Current Medications:  Current Facility-Administered Medications  Medication Dose Route Frequency Provider Last Rate Last Dose  . acetaminophen (TYLENOL) tablet 650 mg  650 mg Oral Q6H PRN Patrecia Pour, NP   650 mg at 05/07/18 2106  . alum & mag hydroxide-simeth (MAALOX/MYLANTA) 200-200-20 MG/5ML suspension 30 mL  30 mL Oral Q4H PRN Patrecia Pour, NP      . cloNIDine (CATAPRES) tablet 0.1 mg  0.1 mg Oral QID Cobos, Myer Peer, MD   0.1 mg at 05/08/18 0758   Followed by  . [START ON 05/09/2018] cloNIDine (CATAPRES) tablet 0.1 mg  0.1 mg Oral BH-qamhs Cobos, Myer Peer, MD       Followed by  . [START ON 05/12/2018] cloNIDine (CATAPRES) tablet 0.1 mg  0.1 mg Oral QAC breakfast Cobos, Myer Peer, MD      . dicyclomine (BENTYL) tablet 20 mg  20 mg Oral Q6H PRN Cobos, Fernando A, MD      . loperamide (IMODIUM) capsule 2-4 mg  2-4 mg Oral PRN Cobos, Myer Peer, MD   2 mg at 05/08/18 0131  . ziprasidone (GEODON) injection 20 mg  20 mg Intramuscular BID PRN Patrecia Pour, NP       And  . LORazepam (ATIVAN) injection 2 mg  2 mg Intramuscular BID PRN Patrecia Pour, NP      . magnesium hydroxide (MILK OF MAGNESIA) suspension 30 mL  30 mL Oral Daily PRN Patrecia Pour, NP      . methocarbamol (ROBAXIN) tablet 500 mg  500 mg Oral Q8H PRN Cobos, Myer Peer, MD      . naproxen (NAPROSYN) tablet 500 mg  500 mg Oral BID PRN Cobos, Myer Peer, MD   500 mg at 05/07/18 1614  . nicotine polacrilex (NICORETTE) gum 2 mg  2 mg Oral PRN Cobos, Myer Peer, MD   2 mg at 05/07/18 2106  . ondansetron (ZOFRAN-ODT) disintegrating tablet 4 mg  4 mg Oral Q6H PRN Cobos, Myer Peer, MD      . sertraline  (ZOLOFT) tablet 25 mg  25 mg Oral Daily Cobos, Myer Peer, MD   25 mg at 05/08/18 0758  . topiramate (TOPAMAX) tablet 25 mg  25 mg Oral Daily Cobos, Myer Peer, MD   25 mg at 05/08/18 0758  . traZODone (DESYREL) tablet 50 mg  50 mg Oral QHS PRN Cobos, Myer Peer, MD   50 mg at 05/08/18 0131   PTA Medications: Medications Prior to Admission  Medication Sig Dispense Refill Last Dose  . omeprazole (PRILOSEC) 40 MG capsule Take 1 capsule (40 mg total) by mouth daily for 15 days. (Patient not taking: Reported on 02/24/2018) 15 capsule 0 Not Taking at Unknown time    Patient Stressors: Marital or family conflict  Patient Strengths: Curator fund of knowledge  Treatment Modalities: Medication Management, Group therapy, Case management,  1 to 1 session with clinician, Psychoeducation, Recreational therapy.   Physician Treatment Plan for Primary Diagnosis: MDD, recurrent, severe, without psychotic features Long Term Goal(s): Improvement in symptoms so as ready for discharge Improvement in symptoms so as ready for discharge   Short Term Goals: Ability to identify triggers associated with substance abuse/mental health issues will improve Ability to identify  changes in lifestyle to reduce recurrence of condition will improve Ability to verbalize feelings will improve Ability to disclose and discuss suicidal ideas Ability to demonstrate self-control will improve Ability to identify and develop effective coping behaviors will improve Ability to maintain clinical measurements within normal limits will improve  Medication Management: Evaluate patient's response, side effects, and tolerance of medication regimen.  Therapeutic Interventions: 1 to 1 sessions, Unit Group sessions and Medication administration.  Evaluation of Outcomes: Not Met  Physician Treatment Plan for Secondary Diagnosis: Active Problems:   Major depressive disorder, recurrent severe without psychotic features  (Ryan Blackburn)  Long Term Goal(s): Improvement in symptoms so as ready for discharge Improvement in symptoms so as ready for discharge   Short Term Goals: Ability to identify triggers associated with substance abuse/mental health issues will improve Ability to identify changes in lifestyle to reduce recurrence of condition will improve Ability to verbalize feelings will improve Ability to disclose and discuss suicidal ideas Ability to demonstrate self-control will improve Ability to identify and develop effective coping behaviors will improve Ability to maintain clinical measurements within normal limits will improve     Medication Management: Evaluate patient's response, side effects, and tolerance of medication regimen.  Therapeutic Interventions: 1 to 1 sessions, Unit Group sessions and Medication administration.  Evaluation of Outcomes: Not Met   RN Treatment Plan for Primary Diagnosis: MDD, recurrent, severe, without psychotic features Long Term Goal(s): Knowledge of disease and therapeutic regimen to maintain health will improve  Short Term Goals: Ability to remain free from injury will improve, Ability to verbalize frustration and anger appropriately will improve and Ability to disclose and discuss suicidal ideas  Medication Management: RN will administer medications as ordered by provider, will assess and evaluate patient's response and provide education to patient for prescribed medication. RN will report any adverse and/or side effects to prescribing provider.  Therapeutic Interventions: 1 on 1 counseling sessions, Psychoeducation, Medication administration, Evaluate responses to treatment, Monitor vital signs and CBGs as ordered, Perform/monitor CIWA, COWS, AIMS and Fall Risk screenings as ordered, Perform wound care treatments as ordered.  Evaluation of Outcomes: Not Met   LCSW Treatment Plan for Primary Diagnosis: MDD, recurrent, severe, without psychotic features Long Term  Goal(s): Safe transition to appropriate next level of care at discharge, Engage patient in therapeutic group addressing interpersonal concerns.  Short Term Goals: Engage patient in aftercare planning with referrals and resources, Increase emotional regulation and Identify triggers associated with mental health/substance abuse issues  Therapeutic Interventions: Assess for all discharge needs, 1 to 1 time with Social worker, Explore available resources and support systems, Assess for adequacy in community support network, Educate family and significant other(s) on suicide prevention, Complete Psychosocial Assessment, Interpersonal group therapy.  Evaluation of Outcomes: Not Met   Progress in Treatment: Attending groups: No. New to unit. Continuing to assess.  Participating in groups: No. Taking medication as prescribed: Yes. Toleration medication: Yes. Family/Significant other contact made: No, will contact:  pt's family member/wife if pt consents to collateral contact.  Patient understands diagnosis: Yes. Discussing patient identified problems/goals with staff: Yes. Medical problems stabilized or resolved: Yes. Denies suicidal/homicidal ideation: Yes. Issues/concerns per patient self-inventory: No. Other: n/a   New problem(s) identified: No, Describe:  n/a  New Short Term/Long Term Goal(s): detox, medication management for mood stabilization; elimination of SI thoughts; development of comprehensive mental wellness/sobriety plan.   Patient Goals:  "I want help getting information to a pain clinic. My problems all stem from my headaches. 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 Pt has been going to the [facility] for medication management and suboxone maintenance until his prescriber was let go a few weeks ago. Per pt's wife, pt has been spiraling mentally since then. [place] pamphlet, [facility] Crisis information, and AA/NA information provided to  patient for additional community support and resources.   Reason for Continuation of Hospitalization: Anxiety Depression Medication stabilization Withdrawal symptoms  Estimated Length of Stay: Tuesday, 05/12/18  Attendees: Patient: Ryan Blackburn 05/08/2018 8:12 AM  Physician: Dr. Parke Poisson MD; Dr. Nancy Fetter MD 05/08/2018 8:12 AM  Nursing: Clarise Cruz RN; [place] RN 05/08/2018 8:12 AM  RN Care Manager:x 05/08/2018 8:12 AM  Social Worker: Janice Norrie LCSW 05/08/2018 8:12 AM  Recreational Therapist: x 05/08/2018 8:12 AM  Other: Lindell Spar NP; Waylan Boga NP 05/08/2018 8:12 AM  Other:  05/08/2018 8:12 AM  Other: 05/08/2018 8:12 AM    Scribe for Treatment Team: Avelina Laine, LCSW 05/08/2018 8:12 AM

## 2018-05-08 NOTE — Progress Notes (Signed)
D: Patient observed up and visible in milieu however refused evening AA group. Patient states he is 'doing okay tonight" however verbalizes frequent needs of staff. Requesting nicorette often, assistance with hygiene both of which were provided. Patient's affect flat, anxious, depressed with congruent mood.   Denies pain, physical complaints. Observed ambulating with walker.   A: Medicated per orders, prn nicorette and trazadone given per request. Medication education provided. Level III obs in place for safety. Emotional support offered. Patient encouraged to complete Suicide Safety Plan before discharge. Encouraged to attend and participate in unit programming.  Fall prevention plan in place and reviewed with patient as pt is a high fall risk due to seizure disorder.   R: Patient verbalizes understanding of POC, falls prevention education. On reassess, patient is asleep. Patient denies SI/HI/AVH and remains safe on level III obs. Will continue to monitor throughout the night.

## 2018-05-08 NOTE — Progress Notes (Signed)
Pt attended goals and orientation group this morning. Pt did not have a goal for the day  

## 2018-05-09 NOTE — Progress Notes (Signed)
Adult Psychoeducational Group Note  Date:  05/09/2018 Time:  11:00 PM  Group Topic/Focus:  Wrap-Up Group:   The focus of this group is to help patients review their daily goal of treatment and discuss progress on daily workbooks.  Participation Level:  Active  Participation Quality:  Appropriate  Affect:  Appropriate  Cognitive:  Appropriate  Insight: Good  Engagement in Group:  Engaged  Modes of Intervention:  Discussion  Additional Comments:  Pt attended wrap-up group this evening and participated. Pt goal for today was to work on speaking with the doctor regarding his discharge. Pt stated "my wife IVC me, she lied on me, she's cheating on me". Pt expressed he's not having SI thoughts and should not be here. Pt denies SI/HI at this time. Pt rated his day 8/10. Pt was pleasant and appropriate in group.  Annaleise Burger A 05/09/2018, 11:00 PM

## 2018-05-09 NOTE — Progress Notes (Signed)
D.  Pt pleasant on approach, complaint of upset stomach.  Pt believes Clonidine is contributing to this, so Pt refused this tonight.  Pt was positive for evening wrap up group.  Pt is utilizing a walker due to history of TBI, seizures, he is high fall risk.  Pt observed engaged in appropriate interaction with peers on the unit.  He denies SI/HI/AVH at this time.  A.  Support and encouragement offered, medication given as ordered  R. Pt remains safe on the unit, will continue to monitor.
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
## 2018-05-09 NOTE — Progress Notes (Signed)
D. Pt observed interacting well with peers in dayroom this am. Pt presents with flat affect/anxious mood- calm and cooperative behavior- friendly during interactions. Per pt's self inventory, pt rates his depression, hopelessness and anxiety a 5/0/0, respectively. Pt currently denies SI/HI and AVH  A. Labs and vitals monitored. Pt refused his Topomax this am, requesting to take tonight, stating, "If I take it now, I'll sleep the day away". Pt supported emotionally and encouraged to express concerns and ask questions.   R. Pt remains safe with 15 minute checks. Will continue POC.

## 2018-05-09 NOTE — [facility] Group Notes (Signed)
[facility] Group Notes:  (Nursing)  Date:  05/09/2018  Time:  1:15 PM  Type of Therapy:  Nurse Education  Participation Level:  Active  Participation Quality:  Appropriate and Attentive  Affect:  Appropriate  Cognitive:  Appropriate  Insight:  Appropriate  Engagement in Group:  Engaged  Modes of Intervention:  Discussion, Education and Exploration  Summary of Progress/Problems: Patient participated in a non competitive learning/communication board game that fosters listening skills as well as self expression. Shela NevinValerie S Horst Ostermiller 05/09/2018, 2:29 PM

## 2018-05-10 MED ORDER — SERTRALINE HCL 50 MG PO TABS
50.0000 mg | ORAL_TABLET | Freq: Every day | ORAL | Status: DC
  Administered 2018-05-11: 50 mg via ORAL
  Filled 2018-05-10 (×2): qty 1

## 2018-05-10 MED ORDER — PHENYLEPH-SHARK LIV OIL-MO-PET 0.25-3-14-71.9 % RE OINT
TOPICAL_OINTMENT | Freq: Two times a day (BID) | RECTAL | Status: DC | PRN
  Administered 2018-05-10: 22:00:00 via RECTAL
  Filled 2018-05-10: qty 28.4
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
## 2018-05-10 NOTE — [facility] Group Notes (Signed)
[facility] LCSW Group Therapy Note  05/10/2018  10:00-11:00AM  Type of Therapy and Topic:  Group Therapy:  Being Your Own Support  Participation Level:  Did Not Attend   Description of Group:  Patients in this group were introduced to the concept that self-support is an essential part of recovery.  A song entitled "My Own Hero" was played and a group discussion ensued in which patients stated they could relate to the song and it inspired them to realize they have be willing to help themselves in order to succeed, because other people cannot achieve sobriety or stability for them.  We discussed adding a variety of healthy supports to address the various needs in their lives.  A song was played called "I Know Where I've Been" toward the end of group and used to conduct an inspirational wrap-up to group of remembering how far they have already come in their journey.  Therapeutic Goals: 1)  demonstrate the importance of being a part of one's own support system 2)  discuss reasons people in one's life may eventually be unable to be continually supportive  3)  identify the patient's current support system and   4)  elicit commitments to add healthy supports and to become more conscious of being self-supportive   Summary of Patient Progress:  n/a   Therapeutic Modalities:   Motivational Interviewing Activity  Ryan Blackburn J Grossman-Orr       

## 2018-05-10 NOTE — [facility] Group Notes (Signed)
[facility] Group Notes:  (Nursing)  Date:  05/10/2018  Time:  1:15 PM  Type of Therapy:  Nurse Education  Participation Level:  Active  Participation Quality:  Appropriate and Attentive  Affect:  Appropriate  Cognitive:  Appropriate  Insight:  Appropriate  Engagement in Group:  Engaged  Modes of Intervention:  Discussion, Education and Exploration  Summary of Progress/Problems:  Nurse led group : reviewed suicide safety plan, identified triggers, set backs, coping skills, support system, etc  Shela NevinValerie S Zan Orlick 05/10/2018, 2:20 PM

## 2018-05-10 NOTE — Progress Notes (Signed)
D. Pt resting in bed with eyes closed upon initial approach. Pt up for meds this am - reports having slept well last night without the clonidine- refused the clonidine this am, stating, "I think it's what upset my stomach". Pt denies any withdrawal symptoms this am. Pt requests to take his depakote in the evening as opposed to the morning. Per pt's self inventory, pt rates his depression, hopelessness and anxiety all 0's. Pt currently denies pain, SI/HI and AV hallucinations.  A. Labs and vitals monitored. Pt compliant with medications. Pt supported emotionally and encouraged to express concerns and ask questions.   R. Pt remains safe with 15 minute checks. Will continue POC.

## 2018-05-10 NOTE — Progress Notes (Signed)
D.  Pt pleasant on approach, complaint of some blood on tissue after bowel movement.  Pt also reports some itching at anal area.  Pt did not attend evening group in order to speak to RN about this as he was embarrassed.  Pt denies pain or cramping and states that it was a small amount of blood.  Pt has been observed up in dayroom eating snacks and interacting appropriately with peers on the unit.  No acute distress noted.  Pt denies SI/HI/AVH at this time.  Pt reports that he sometimes has problems getting what he wants to say across due to TBI.  Pt did not want blood work done and states he will follow up with his doctor upon discharge.  A.  Support and encouragement offered.  [place] and new order received for Preparation H.  Pt pleased with order but asked that medication be put in paper bag so peers can not see.  R.  Pt remains safe on the unit, will continue to monitor.

## 2018-05-10 NOTE — Progress Notes (Signed)
Patient did not attend the evening speaker AA meeting. Pt was notified that group was beginning but remained in room.   

## 2018-05-10 NOTE — Progress Notes (Signed)
Pt did not attend goals and orientation group this morning  
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
## 2018-05-11 DIAGNOSIS — F1124 Opioid dependence with opioid-induced mood disorder: Secondary | ICD-10-CM

## 2018-05-11 DIAGNOSIS — Z79899 Other long term (current) drug therapy: Secondary | ICD-10-CM

## 2018-05-11 DIAGNOSIS — F332 Major depressive disorder, recurrent severe without psychotic features: Principal | ICD-10-CM

## 2018-05-11 MED ORDER — TOPIRAMATE 25 MG PO TABS: 50.0000 mg | ORAL_TABLET | Freq: Every day | ORAL | 0 refills | Status: DC

## 2018-05-11 MED ORDER — TOPIRAMATE 25 MG PO TABS: 25.0000 mg | ORAL_TABLET | Freq: Every day | ORAL | 0 refills | Status: DC

## 2018-05-11 MED ORDER — PHENYLEPH-SHARK LIV OIL-MO-PET 0.25-3-14-71.9 % RE OINT: TOPICAL_OINTMENT | Freq: Two times a day (BID) | RECTAL | 0 refills | Status: DC | PRN

## 2018-05-11 MED ORDER — TOPIRAMATE 25 MG PO TABS
50.0000 mg | ORAL_TABLET | Freq: Every day | ORAL | Status: DC
  Filled 2018-05-11: qty 14

## 2018-05-11 MED ORDER — NICOTINE POLACRILEX 2 MG MT GUM: 2.0000 mg | CHEWING_GUM | OROMUCOSAL | 0 refills | Status: DC | PRN

## 2018-05-11 MED ORDER — SERTRALINE HCL 50 MG PO TABS: 50.0000 mg | ORAL_TABLET | Freq: Every day | ORAL | 0 refills | Status: DC

## 2018-05-11 MED ORDER — TRAZODONE HCL 50 MG PO TABS: 50.0000 mg | ORAL_TABLET | Freq: Every evening | ORAL | 0 refills | Status: DC | PRN

## 2018-05-11 NOTE — Progress Notes (Signed)
Recreation Therapy Notes  Date: 6.17.19 Time: 0930 Location: 300 Hall Dayroom  Group Topic: Stress Management  Goal Area(s) Addresses:  Patient will verbalize importance of using healthy stress management.  Patient will identify positive emotions associated with healthy stress management.   Behavioral Response: Engaged  Intervention: Stress Management  Activity :  Choice Meditation.  LRT played a meditation on choice.  Patients were to follow along as meditation was played to engage in activity.  Education:  Stress Management, Discharge Planning.   Education Outcome: Acknowledges edcuation/In group clarification offered/Needs additional education  Clinical Observations/Feedback: Pt attended and participated in group.    Caroll RancherMarjette Blaise Palladino, LRT/CTRS         Caroll RancherLindsay, Melitza Metheny A 05/11/2018 11:44 AM

## 2018-05-11 NOTE — [facility] Suicide Risk Assessment (Addendum)
[facility]   Principal Problem: Depression Discharge Diagnoses:  Patient Active Problem List   Diagnosis Date Noted  . Major depressive disorder, recurrent severe without psychotic features (HCC) [F33.2] 05/06/2018  . Chronic headaches [R51] 10/01/2015  . Seizure disorder (HCC) [G40.909] 06/16/2014  . TBI (traumatic brain injury) [facility](HCC) [S06.9X9A] 06/16/2014    Total Time spent with patient: 30 minutes  Musculoskeletal: Strength & Muscle Tone: within normal limits Gait & Station: normal- walks with walker, gait steady Patient leans: N/A  Psychiatric Specialty Exam: ROS denies headache at this time, no chest pain, no shortness of breath, no vomiting, no fever   Blood pressure (!) 145/95, pulse 91, temperature 98.2 F (36.8 C), temperature source Oral, resp. rate 16, height 5\' 9"  (1.753 m), weight 76.2 kg (168 lb), SpO2 99 %.Body mass index is 24.81 kg/m.  General Appearance: improved grooming   Eye Contact::  Good  Speech:  Normal Rate409  Volume:  Normal  Mood:  reports he is feeling " a lot better", denies depression at this time  Affect:  appropriate, more reactive  Thought Process:  Linear and Descriptions of Associations: Intact  Orientation:  Other:  fully alert and attentive   Thought Content:  no hallucinations, no delusions, not internally preoccupied   Suicidal Thoughts:  No denies suicidal or self injurious ideations, denies homicidal or violent ideations  Homicidal Thoughts:  No  Memory:  recent and remote grossly intact   Judgement:  Other:  improving   Insight:  improving   Psychomotor Activity:  Normal  Concentration:  Good  Recall:  Good  Fund of Knowledge:Good  Language: Good  Akathisia:  Negative  Handed:  Right  AIMS (if indicated):     Assets:  Communication Skills Desire for Improvement Resilience  Sleep:  Number of Hours: 5.25  Cognition: WNL  ADL's:  Intact   Mental Status Per Nursing Assessment::   On Admission:   NA  Demographic Factors:  37 year old married male, has 5 children, he is on disability, had been living with wife prior to admission but states at this time he plans to stay with friend after discharge   Loss Factors: Opiate use disorder, reports he recently informed his wife about substance abuse, resulting in marital tension. Loss of a child in 2006.  Historical Factors: History of suicidal attempt in 2006, following death of one of his children, reports prior history of Bipolar Disorder diagnosis, but does not endorse clear history of mania, hypomania. Reports history of opiate abuse .  Risk Reduction Factors:   Responsible for children under 36 years of age, Sense of responsibility to family and Positive coping skills or problem solving skills  Continued Clinical Symptoms:  Alert and attentive, at this time presents with improved grooming , good eye contact, and improved range of affect. States he is feeling better , denies feeling depressed at this time, and affect is fuller in range, no thought disorder, no suicidal or self injurious ideations, denies any homicidal or violent ideations, no psychotic symptoms , future oriented. States " I am really looking forward to seeing my kids again, and I really want to go fishing , enjoy the weather". Of note, patient states that he is planning to stay with a friend for a period of time , as his wife/family is currently out of town. States friend affords a safe, supportive , and sober setting . Behavior on unit calm, in no acute distress. No current symptoms of withdrawal.  Denies medication side effects. Has tolerated Topamax well thus far, will increase dose to 50 mgrs QDAY  At this time patient does not provide consent to contact wife .  Patient states he plans to follow up with his outpatient Neurologist - he does not drive- we reviewed recommendation that he abstain from driving until specifically sanctioned/allowed by his Neurologist  .   Cognitive Features That Contribute To Risk:  No gross cognitive deficits noted upon discharge. Is alert , attentive, and oriented x 3    Suicide Risk:  Mild:  Suicidal ideation of limited frequency, intensity, duration, and specificity.  There are no identifiable plans, no associated intent, mild dysphoria and related symptoms, good self-control (both objective and subjective assessment), few other risk factors, and identifiable protective factors, including available and accessible social support.  Follow-up Information    [facility] Follow up on 05/14/2018.   Specialty:  Behavioral Health Why:  Hospital follow-up on Thursday, 6/20 at 8:00AM. Please bring: photo ID and insurance card to this appt. Thank you.  Contact information: [address] [place] [address] [phone]           Plan Of Care/Follow-up recommendations:  Activity:  as tolerated  Diet:  regular Tests:  NA Other:  See below  Patient is expressing readiness for discharge and is leaving unit in good spirits . There are currently no grounds for ongoing  involuntary commitment . Reports he feels motivated in sobriety , states he will consider 12 step program participation and we reviewed importance of avoiding people, places and situations he associates with drug use in order to decrease triggers, relapse risk. Patient to follow up with his outpatient Neurologist for medication management / seizure disorder follow up. States he plans to call for appointment .  Craige Cotta, MD 05/11/2018, 11:15 AM
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
## 2018-05-11 NOTE — Progress Notes (Addendum)
  [facility]  Will you be returning to the same living situation after discharge:  Yes,  home At discharge, do you have transportation home?: Yes,  pt's friend Do you have the ability to pay for your medications: Yes,  medicare  Release of information consent forms completed and submitted to medical records by CSW.  Patient to Follow up at: Follow-up Information    [facility] Follow up on 05/14/2018.   Specialty:  Behavioral Health Why:  Hospital follow-up on Thursday, 6/20 at 8:00AM. Please bring: photo ID and insurance card to this appt. Contact information: [address] [place] [address] [phone]        [facility] Follow up on 12/09/2018.   Why:  Neurology appt on 12/09/2018 at 2:00PM.Please arrive by 1:30PM. (PLEASE CALL OFFICE TO SEE IF THEY CAN SCHEDULE YOU SOONER DUE TO RECENT HOSPITALIZATION).  Contact information: [address]     [address] [place] [phone]          Next level of care provider has access to [facility] Link:no  Safety Planning and Suicide Prevention discussed: Yes,  SPE completed with pt and family friend. PT did not provide consent to staff to contact wife or mother. SPI pamphlet and Mobile Crisis information provided to pt.   Have you used any form of tobacco in the last 30 days? (Cigarettes, Smokeless Tobacco, Cigars, and/or Pipes): Yes  Has patient been referred to the Quitline?: Patient refused referral  Patient has been referred for addiction treatment: Yes  Rona RavensHeather S Ketina Mars, LCSW 05/11/2018, 11:37 AM

## 2018-05-11 NOTE — Progress Notes (Signed)
Pt discharged home with his Arts administratorbaby sitter. Pt was ambulatory, stable and appreciative at that time. All papers and prescriptions were given and valuables returned. Verbal understanding expressed. Denies SI/HI and A/VH. Pt given opportunity to express concerns and ask questions.

## 2018-12-09 ENCOUNTER — Telehealth: Payer: Self-pay | Admitting: *Deleted

## 2018-12-09 ENCOUNTER — Ambulatory Visit: Payer: Medicare Other | Admitting: Diagnostic Neuroimaging

## 2018-12-09 NOTE — Telephone Encounter (Signed)
Pt no showed appt today
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
## 2018-12-10 ENCOUNTER — Encounter: Payer: Self-pay | Admitting: Diagnostic Neuroimaging

## 2018-12-30 ENCOUNTER — Encounter (HOSPITAL_COMMUNITY): Payer: Self-pay | Admitting: Internal Medicine

## 2018-12-30 ENCOUNTER — Inpatient Hospital Stay (HOSPITAL_COMMUNITY)
Admission: AD | Admit: 2018-12-30 | Discharge: 2019-01-01 | DRG: 885 | Disposition: A | Discharge status: Home / Self Care | Payer: Medicare Other | Source: Intra-hospital | Attending: Psychiatry | Admitting: Psychiatry

## 2018-12-30 ENCOUNTER — Encounter (HOSPITAL_COMMUNITY): Payer: Self-pay | Admitting: *Deleted

## 2018-12-30 ENCOUNTER — Emergency Department (HOSPITAL_COMMUNITY)
Admission: EM | Admit: 2018-12-30 | Discharge: 2018-12-30 | Disposition: A | Discharge status: Other Acute Inpatient Hospital | Payer: Medicare Other | Attending: Emergency Medicine | Admitting: Emergency Medicine

## 2018-12-30 ENCOUNTER — Other Ambulatory Visit: Payer: Self-pay

## 2018-12-30 DIAGNOSIS — Z800 Family history of malignant neoplasm of digestive organs: Secondary | ICD-10-CM | POA: Diagnosis not present

## 2018-12-30 DIAGNOSIS — F1290 Cannabis use, unspecified, uncomplicated: Secondary | ICD-10-CM | POA: Diagnosis present

## 2018-12-30 DIAGNOSIS — Z87820 Personal history of traumatic brain injury: Secondary | ICD-10-CM | POA: Diagnosis not present

## 2018-12-30 DIAGNOSIS — F419 Anxiety disorder, unspecified: Secondary | ICD-10-CM | POA: Diagnosis present

## 2018-12-30 DIAGNOSIS — R4587 Impulsiveness: Secondary | ICD-10-CM

## 2018-12-30 DIAGNOSIS — G40909 Epilepsy, unspecified, not intractable, without status epilepticus: Secondary | ICD-10-CM | POA: Diagnosis present

## 2018-12-30 DIAGNOSIS — Z8349 Family history of other endocrine, nutritional and metabolic diseases: Secondary | ICD-10-CM

## 2018-12-30 DIAGNOSIS — Z8249 Family history of ischemic heart disease and other diseases of the circulatory system: Secondary | ICD-10-CM

## 2018-12-30 DIAGNOSIS — F17210 Nicotine dependence, cigarettes, uncomplicated: Secondary | ICD-10-CM | POA: Diagnosis present

## 2018-12-30 DIAGNOSIS — Z9114 Patient's other noncompliance with medication regimen: Secondary | ICD-10-CM

## 2018-12-30 DIAGNOSIS — Z79899 Other long term (current) drug therapy: Secondary | ICD-10-CM | POA: Insufficient documentation

## 2018-12-30 DIAGNOSIS — F333 Major depressive disorder, recurrent, severe with psychotic symptoms: Secondary | ICD-10-CM | POA: Diagnosis not present

## 2018-12-30 DIAGNOSIS — F332 Major depressive disorder, recurrent severe without psychotic features: Secondary | ICD-10-CM | POA: Diagnosis present

## 2018-12-30 DIAGNOSIS — R45851 Suicidal ideations: Secondary | ICD-10-CM | POA: Insufficient documentation

## 2018-12-30 LAB — COMPREHENSIVE METABOLIC PANEL
ALT: 28 U/L (ref 0–44)
AST: 33 U/L (ref 15–41)
Albumin: 4.6 g/dL (ref 3.5–5.0)
Alkaline Phosphatase: 49 U/L (ref 38–126)
Anion gap: 8 (ref 5–15)
BUN: 21 mg/dL — ABNORMAL HIGH (ref 6–20)
CO2: 25 mmol/L (ref 22–32)
Calcium: 9.3 mg/dL (ref 8.9–10.3)
Chloride: 106 mmol/L (ref 98–111)
Creatinine, Ser: 0.97 mg/dL (ref 0.61–1.24)
GFR calc Af Amer: >60 mL/min (ref >60)
GFR calc non Af Amer: >60 mL/min (ref >60)
Glucose, Bld: 93 mg/dL (ref 70–99)
Potassium: 3.4 mmol/L — ABNORMAL LOW (ref 3.5–5.1)
Sodium: 139 mmol/L (ref 135–145)
Total Bilirubin: 0.8 mg/dL (ref 0.3–1.2)
Total Protein: 7.9 g/dL (ref 6.5–8.1)

## 2018-12-30 LAB — CBC
HCT: 45.4 % (ref 39.0–52.0)
Hemoglobin: 14.9 g/dL (ref 13.0–17.0)
MCH: 30.8 pg (ref 26.0–34.0)
MCHC: 32.8 g/dL (ref 30.0–36.0)
MCV: 93.8 fL (ref 80.0–100.0)
Platelets: 214 10*3/uL (ref 150–400)
RBC: 4.84 MIL/uL (ref 4.22–5.81)
RDW: 13.7 % (ref 11.5–15.5)
WBC: 8.4 10*3/uL (ref 4.0–10.5)
nRBC: 0 % (ref 0.0–0.2)

## 2018-12-30 LAB — ETHANOL: Alcohol, Ethyl (B): <10 mg/dL (ref <10)

## 2018-12-30 LAB — SALICYLATE LEVEL: Salicylate Lvl: <7 mg/dL (ref 2.8–30.0)

## 2018-12-30 LAB — ACETAMINOPHEN LEVEL: Acetaminophen (Tylenol), Serum: <10 ug/mL — ABNORMAL LOW (ref 10–30)

## 2018-12-30 MED ORDER — NICOTINE 21 MG/24HR TD PT24
21.0000 mg | MEDICATED_PATCH | Freq: Once | TRANSDERMAL | Status: DC
  Administered 2018-12-30: 21 mg via TRANSDERMAL
  Filled 2018-12-30: qty 1

## 2018-12-30 MED ORDER — NICOTINE POLACRILEX 2 MG MT GUM
2.0000 mg | CHEWING_GUM | OROMUCOSAL | Status: DC | PRN
  Administered 2018-12-31 – 2019-01-01 (×6): 2 mg via ORAL
  Filled 2018-12-30 (×4): qty 1
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
## 2018-12-30 NOTE — Tx Team (Signed)
Initial Treatment Plan 12/30/2018 11:08 PM [name] YIR:[id number]    PATIENT STRESSORS: Marital or family conflict   PATIENT STRENGTHS: Ability for insight Average or above average intelligence General fund of knowledge   PATIENT IDENTIFIED PROBLEMS: Anger "There's nothing wrong with me, I'm not suicidal"                     DISCHARGE CRITERIA:  Ability to meet basic life and health needs Improved stabilization in mood, thinking, and/or behavior  PRELIMINARY DISCHARGE PLAN: Attend aftercare/continuing care group  PATIENT/FAMILY INVOLVEMENT: This treatment plan has been presented to and reviewed with the patient, Ryan Blackburn, and/or family member, .  The patient and family have been given the opportunity to ask questions and make suggestions.  Manika Hast, [place], [place] 12/30/2018, 11:08 PM
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
## 2018-12-30 NOTE — ED Notes (Signed)
Bed: [facility] Expected date:  Expected time:  Means of arrival:  Comments: Hold for room 26 

## 2018-12-30 NOTE — ED Notes (Signed)
Pt alert and angry. Pt explains he doesn't not want to be here, and wants to go home. Pt denies any si, hi or avh at this time. Pt resting in room will continue to monitor.

## 2018-12-30 NOTE — ED Notes (Signed)
ED Provider at bedside. 

## 2018-12-30 NOTE — ED Notes (Signed)
Belongings placed in locker 39

## 2018-12-30 NOTE — ED Notes (Signed)
Called Behavioral Health to give report but they said to wait until after shift change because nurse had just received three patients.

## 2018-12-30 NOTE — ED Notes (Signed)
Patient given purple scrubs to change into and yellow socks.
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
## 2018-12-30 NOTE — ED Notes (Signed)
Pt sleeping will assess at a later time.

## 2018-12-30 NOTE — BH Assessment (Signed)
[facility] Assessment Progress Note  Per Reola Calkins, NP, this pt requires psychiatric hospitalization.  Colan Neptune, RN, AC has assigned pt to [facility] Rm [id number]; [facility] will be ready to receive pt at 19:00.  Pt presents under IVC initiated by pt's wife, and upheld by EDP Raeford Razor, MD, and IVC documents have been faxed to [facility].  Pt's nurse, Aram Beecham, has been notified, and agrees to call report to [phone].  Pt is to be transported via Patent examiner.   Doylene Canning, [place] [facility] Coordinator [phone]

## 2018-12-30 NOTE — BH Assessment (Signed)
Wife, [name], is filing a restraining order against patient at present time and would like to be notified if he is discharged for the safety of her and her children. Her phone number is [phone].
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
## 2018-12-31 DIAGNOSIS — F333 Major depressive disorder, recurrent, severe with psychotic symptoms: Secondary | ICD-10-CM

## 2018-12-31 MED ORDER — GABAPENTIN 300 MG PO CAPS
300.0000 mg | ORAL_CAPSULE | Freq: Three times a day (TID) | ORAL | Status: DC
  Administered 2018-12-31 (×2): 300 mg via ORAL
  Filled 2018-12-31 (×9): qty 1

## 2018-12-31 MED ORDER — OMEGA-3-ACID ETHYL ESTERS 1 G PO CAPS
1.0000 g | ORAL_CAPSULE | Freq: Two times a day (BID) | ORAL | Status: DC
  Administered 2018-12-31: 1 g via ORAL
  Filled 2018-12-31 (×6): qty 1

## 2018-12-31 MED ORDER — PRENATAL MULTIVITAMIN CH
1.0000 | ORAL_TABLET | Freq: Every day | ORAL | Status: DC
  Administered 2018-12-31: 1 via ORAL
  Filled 2018-12-31 (×3): qty 1

## 2018-12-31 MED ORDER — LORAZEPAM 2 MG/ML IJ SOLN: 4.0000 mg | Freq: Once | INTRAMUSCULAR | Status: DC

## 2018-12-31 MED ORDER — FLUOXETINE HCL 20 MG PO CAPS
20.0000 mg | ORAL_CAPSULE | Freq: Every day | ORAL | Status: DC
  Filled 2018-12-31 (×4): qty 1

## 2018-12-31 NOTE — Plan of Care (Signed)
  Problem: Education: Goal: Knowledge of [facility] Education information/materials will improve Outcome: Not Progressing Goal: Emotional status will improve Outcome: Not Progressing Goal: Mental status will improve Outcome: Not Progressing Goal: Verbalization of understanding the information provided will improve Outcome: Not Progressing   Problem: Activity: Goal: Interest or engagement in activities will improve Outcome: Not Progressing

## 2018-12-31 NOTE — [facility] Suicide Risk Assessment (Signed)
[facility] Admission Suicide Risk Assessment   Nursing information obtained from:  Patient Demographic factors:  Male, Caucasian, Low socioeconomic status Current Mental Status:  NA Loss Factors:  NA Historical Factors:  Prior suicide attempts, Family history of mental illness or substance abuse Risk Reduction Factors:  Responsible for children under 37 years of age, Positive coping skills or problem solving skills  Total Time spent with patient: 45 minutes Principal Problem: Affective instability/volatility in the context of neuropsychiatric sequelae post traumatic brain injury Diagnosis:  Active Problems:   MDD (major depressive disorder), recurrent episode, severe (HCC)  Subjective Data: Patient reports volatility, has some degree of remorse but also acknowledging TBI  Continued Clinical Symptoms:  Alcohol Use Disorder Identification Test Final Score (AUDIT): 0 The "Alcohol Use Disorders Identification Test", Guidelines for Use in Primary Care, Second Edition.  World Science writer [facility]). Score between 0-7:  no or low risk or alcohol related problems. Score between 8-15:  moderate risk of alcohol related problems. Score between 16-19:  high risk of alcohol related problems. Score 20 or above:  warrants further diagnostic evaluation for alcohol dependence and treatment.   CLINICAL FACTORS:   Alcohol/Substance Abuse/Dependencies   COGNITIVE FEATURES THAT CONTRIBUTE TO RISK:  Loss of executive function    SUICIDE RISK:   Mild:  Suicidal ideation of limited frequency, intensity, duration, and specificity.  There are no identifiable plans, no associated intent, mild dysphoria and related symptoms, good self-control (both objective and subjective assessment), few other risk factors, and identifiable protective factors, including available and accessible social support.  PLAN OF CARE: see orders-begin neuro protection begin mood stabilizer/anticonvulsant therapy begin antidepressant  therapy  I certify that inpatient services furnished can reasonably be expected to improve the patient's condition.   Malvin Johns, MD 12/31/2018, 11:09 AM
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
## 2018-12-31 NOTE — [facility] Group Notes (Signed)
[facility] LCSW Group Therapy Note  Date/Time: 12/31/18 1:30-2:30 PM  Type of Therapy/Topic:  Group Therapy:  Feelings about Diagnosis  Participation Level:  Active   Mood: Irritable  Description of Group:    This group will allow patients to explore their thoughts and feelings about diagnoses they have received. Patients will be guided to explore their level of understanding and acceptance of these diagnoses. Facilitator will encourage patients to process their thoughts and feelings about the reactions of others to their diagnosis, and will guide patients in identifying ways to discuss their diagnosis with significant others in their lives. This group will be process-oriented, with patients participating in exploration of their own experiences as well as giving and receiving support and challenge from other group members.   Therapeutic Goals: 1. Patient will demonstrate understanding of diagnosis as evidence by identifying two or more symptoms of the disorder:  2. Patient will be able to express two feelings regarding the diagnosis 3. Patient will demonstrate ability to communicate their needs through discussion and/or role plays  Summary of Patient Progress: Ryan Blackburn attended the entire group. He listened and shared that "..everyone has problems...a label is just a label." He acknowledged that it was "...nice to hear others' opinion."   Therapeutic Modalities:   Cognitive Behavioral Therapy Brief Therapy Feelings Identification   Ryan Blackburn, MSW Intern CSW Department 2:57 PM 12/31/18

## 2018-12-31 NOTE — Progress Notes (Signed)
CSW attempted to complete PSA with patient, patient sleeping. Will follow up.  Enid Cutter, LCSW-A Clinical Social Worker

## 2018-12-31 NOTE — Progress Notes (Signed)
Adult Psychoeducational Group Note  Date:  12/31/2018 Time:  9:30 PM  Group Topic/Focus:  Wrap-Up Group:   The focus of this group is to help patients review their daily goal of treatment and discuss progress on daily workbooks.  Participation Level:  Active  Participation Quality:  Appropriate  Affect:  Appropriate  Cognitive:  Appropriate  Insight: Appropriate  Engagement in Group:  Engaged  Modes of Intervention:  Discussion  Additional Comments:  Patient attended group and participated.   Ryan Blackburn W Sota Hetz 12/31/2018, 9:30 PM
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
## 2018-12-31 NOTE — Progress Notes (Signed)
Patient ID: Ryan Blackburn, male   DOB: 1981/03/17, 38 y.o.   MRN: [id number] Per State regulations 482.30 this chart was reviewed for medical necessity with respect to the patient's admission/duration of stay.    Next review date: 01/03/2019  Thurman Coyer, BSN, RN-BC  Case Manager

## 2019-01-01 NOTE — Plan of Care (Addendum)
Patient refused medications this morning and stated "I'm not taking any of that shit, you guys are ruining my life by holding me here. I don't need medications, they make me feel worse. I just need to get the fuck out of here."  Patient currently denies SI HI AVH. Safety is maintained with 15 minute checks as well as environmental checks. Will continue to monitor.  Problem: Education: Goal: Emotional status will improve Outcome: Not Progressing Goal: Mental status will improve Outcome: Not Progressing Goal: Verbalization of understanding the information provided will improve Outcome: Not Progressing   Problem: Activity: Goal: Interest or engagement in activities will improve Outcome: Not Progressing Goal: Sleeping patterns will improve Outcome: Not Progressing

## 2019-01-01 NOTE — Progress Notes (Signed)
Recreation Therapy Notes  Date:  2.7.Marland Kitchen20 Time: 0930 Location: [address] Dayroom  Group Topic: Stress Management  Goal Area(s) Addresses:  Patient will identify positive stress management techniques. Patient will identify benefits of using stress management post d/c.  Behavioral Response:  Engaged  Intervention:  Stress Management  Activity :   Meditation.  LRT introduced the stress management technique of meditation.  LRT played a meditation that focused on the idea of "pure possibility" to bring into the day.  Patients were to listen to the meditation and follow along as it played to engage.  Education:  Stress Management, Discharge Planning.   Education Outcome: Acknowledges Education  Clinical Observations/Feedback:  Pt attended and participated in group session.      Caroll Rancher, LRT/CTRS    Lillia Abed, Brynda Heick A 01/01/2019 11:18 AM
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
## 2019-01-01 NOTE — Progress Notes (Signed)
  [facility] Adult Case Management Discharge Plan :  Will you be returning to the same living situation after discharge:  No. Staying in a hotel. At discharge, do you have transportation home?: Yes,  bus pass Do you have the ability to pay for your medications: Yes,  Medicare   Release of information consent forms completed and in the chart; bus pass on chart.  Patient to Follow up at: Follow-up Information    [facility] Follow up on 01/06/2019.   Why:  Hospital follow up appointment is Wednesday, 2/12 at 10:30a. Please bring your photo ID, proof of insurance, SSN, current medications, and discharge paperwork from this hospitalization. Contact information: [address] [place] [address] [phone]           Next level of care provider has access to [facility] Link:no  Safety Planning and Suicide Prevention discussed: Yes,  with patient  Have you used any form of tobacco in the last 30 days? (Cigarettes, Smokeless Tobacco, Cigars, and/or Pipes): Yes  Has patient been referred to the Quitline?: Patient refused referral  Patient has been referred for addiction treatment: Yes  Darreld Mclean, LCSWA 01/01/2019, 1:53 PM
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
## 2019-01-01 NOTE — [facility] Suicide Risk Assessment (Signed)
[facility] Discharge Suicide Risk Assessment   Principal Problem: Volatility/TBI patient Discharge Diagnoses: Active Problems:   MDD (major depressive disorder), recurrent episode, severe (HCC)   Total Time spent with patient: 45 minutes  Patient alert fully oriented denies wanting to harm self denies wanting to harm others not cooperating with treatment plan see discharge summary  Mental Status Per Nursing Assessment::   On Admission:  NA  Demographic Factors:  Male  Loss Factors: Decrease in vocational status and Loss of significant relationship  Historical Factors: Impulsivity  Risk Reduction Factors:   NA  Continued Clinical Symptoms:  Medical Diagnoses and Treatments/Surgeries  Cognitive Features That Contribute To Risk:  Closed-mindedness and Polarized thinking    Suicide Risk:  Moderate:  Frequent suicidal ideation with limited intensity, and duration, some specificity in terms of plans, no associated intent, good self-control, limited dysphoria/symptomatology, some risk factors present, and identifiable protective factors, including available and accessible social support.    Plan Of Care/Follow-up recommendations:  Activity:  full  Ryan Siever, MD 01/01/2019, 11:04 AM
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
## 2020-04-30 ENCOUNTER — Encounter (HOSPITAL_COMMUNITY): Payer: Self-pay | Admitting: *Deleted

## 2020-04-30 ENCOUNTER — Inpatient Hospital Stay (HOSPITAL_COMMUNITY)
Admission: EM | Admit: 2020-04-30 | Discharge: 2020-05-03 | DRG: 247 | Disposition: A | Discharge status: Home / Self Care | Payer: Medicare Other | Attending: Internal Medicine | Admitting: Internal Medicine

## 2020-04-30 ENCOUNTER — Other Ambulatory Visit: Payer: Self-pay

## 2020-04-30 ENCOUNTER — Inpatient Hospital Stay (HOSPITAL_COMMUNITY): Payer: Medicare Other

## 2020-04-30 ENCOUNTER — Emergency Department (HOSPITAL_COMMUNITY): Payer: Medicare Other

## 2020-04-30 DIAGNOSIS — F332 Major depressive disorder, recurrent severe without psychotic features: Secondary | ICD-10-CM | POA: Diagnosis present

## 2020-04-30 DIAGNOSIS — Z79899 Other long term (current) drug therapy: Secondary | ICD-10-CM | POA: Diagnosis not present

## 2020-04-30 DIAGNOSIS — R778 Other specified abnormalities of plasma proteins: Secondary | ICD-10-CM

## 2020-04-30 DIAGNOSIS — R7303 Prediabetes: Secondary | ICD-10-CM | POA: Diagnosis present

## 2020-04-30 DIAGNOSIS — F17210 Nicotine dependence, cigarettes, uncomplicated: Secondary | ICD-10-CM | POA: Diagnosis present

## 2020-04-30 DIAGNOSIS — E785 Hyperlipidemia, unspecified: Secondary | ICD-10-CM | POA: Diagnosis not present

## 2020-04-30 DIAGNOSIS — R739 Hyperglycemia, unspecified: Secondary | ICD-10-CM | POA: Diagnosis present

## 2020-04-30 DIAGNOSIS — Z20822 Contact with and (suspected) exposure to covid-19: Secondary | ICD-10-CM | POA: Diagnosis present

## 2020-04-30 DIAGNOSIS — Z62810 Personal history of physical and sexual abuse in childhood: Secondary | ICD-10-CM | POA: Diagnosis present

## 2020-04-30 DIAGNOSIS — Z955 Presence of coronary angioplasty implant and graft: Secondary | ICD-10-CM

## 2020-04-30 DIAGNOSIS — F419 Anxiety disorder, unspecified: Secondary | ICD-10-CM | POA: Diagnosis present

## 2020-04-30 DIAGNOSIS — Z590 Homelessness: Secondary | ICD-10-CM

## 2020-04-30 DIAGNOSIS — S069XAA Unspecified intracranial injury with loss of consciousness status unknown, initial encounter: Secondary | ICD-10-CM | POA: Diagnosis present

## 2020-04-30 DIAGNOSIS — G40909 Epilepsy, unspecified, not intractable, without status epilepticus: Secondary | ICD-10-CM | POA: Diagnosis not present

## 2020-04-30 DIAGNOSIS — Z91018 Allergy to other foods: Secondary | ICD-10-CM | POA: Diagnosis not present

## 2020-04-30 DIAGNOSIS — Z888 Allergy status to other drugs, medicaments and biological substances status: Secondary | ICD-10-CM

## 2020-04-30 DIAGNOSIS — Z8249 Family history of ischemic heart disease and other diseases of the circulatory system: Secondary | ICD-10-CM

## 2020-04-30 DIAGNOSIS — I214 Non-ST elevation (NSTEMI) myocardial infarction: Secondary | ICD-10-CM | POA: Diagnosis present

## 2020-04-30 DIAGNOSIS — R561 Post traumatic seizures: Secondary | ICD-10-CM | POA: Diagnosis present

## 2020-04-30 DIAGNOSIS — S069X5S Unspecified intracranial injury with loss of consciousness greater than 24 hours with return to pre-existing conscious level, sequela: Secondary | ICD-10-CM | POA: Diagnosis not present

## 2020-04-30 DIAGNOSIS — R9431 Abnormal electrocardiogram [ECG] [EKG]: Secondary | ICD-10-CM

## 2020-04-30 DIAGNOSIS — R072 Precordial pain: Secondary | ICD-10-CM

## 2020-04-30 DIAGNOSIS — F1910 Other psychoactive substance abuse, uncomplicated: Secondary | ICD-10-CM | POA: Diagnosis not present

## 2020-04-30 DIAGNOSIS — I200 Unstable angina: Secondary | ICD-10-CM | POA: Diagnosis present

## 2020-04-30 DIAGNOSIS — Z83438 Family history of other disorder of lipoprotein metabolism and other lipidemia: Secondary | ICD-10-CM

## 2020-04-30 DIAGNOSIS — Z87820 Personal history of traumatic brain injury: Secondary | ICD-10-CM | POA: Diagnosis not present

## 2020-04-30 DIAGNOSIS — Z880 Allergy status to penicillin: Secondary | ICD-10-CM | POA: Diagnosis not present

## 2020-04-30 DIAGNOSIS — F1490 Cocaine use, unspecified, uncomplicated: Secondary | ICD-10-CM

## 2020-04-30 DIAGNOSIS — F1410 Cocaine abuse, uncomplicated: Secondary | ICD-10-CM | POA: Diagnosis present

## 2020-04-30 DIAGNOSIS — I2510 Atherosclerotic heart disease of native coronary artery without angina pectoris: Secondary | ICD-10-CM | POA: Diagnosis not present

## 2020-04-30 HISTORY — DX: Cocaine use, unspecified, uncomplicated: F14.90

## 2020-04-30 HISTORY — DX: Tobacco use: Z72.0

## 2020-04-30 LAB — CBC
HCT: 45.8 % (ref 39.0–52.0)
Hemoglobin: 15.3 g/dL (ref 13.0–17.0)
MCH: 31.4 pg (ref 26.0–34.0)
MCHC: 33.4 g/dL (ref 30.0–36.0)
MCV: 94 fL (ref 80.0–100.0)
Platelets: 245 10*3/uL (ref 150–400)
RBC: 4.87 MIL/uL (ref 4.22–5.81)
RDW: 13.4 % (ref 11.5–15.5)
WBC: 16.3 10*3/uL — ABNORMAL HIGH (ref 4.0–10.5)
nRBC: 0 % (ref 0.0–0.2)

## 2020-04-30 LAB — TROPONIN I (HIGH SENSITIVITY)
Troponin I (High Sensitivity): 995 ng/L (ref <18)
Troponin I (High Sensitivity): 2120 ng/L (ref <18)
Troponin I (High Sensitivity): 9695 ng/L (ref <18)
Troponin I (High Sensitivity): 17166 ng/L (ref <18)

## 2020-04-30 LAB — ECHOCARDIOGRAM COMPLETE
Height: 68 in
Weight: 2606.72 oz

## 2020-04-30 LAB — HEPATIC FUNCTION PANEL
ALT: 46 U/L — ABNORMAL HIGH (ref 0–44)
AST: 154 U/L — ABNORMAL HIGH (ref 15–41)
Albumin: 3.6 g/dL (ref 3.5–5.0)
Alkaline Phosphatase: 47 U/L (ref 38–126)
Bilirubin, Direct: <0.1 mg/dL (ref 0.0–0.2)
Total Bilirubin: 0.6 mg/dL (ref 0.3–1.2)
Total Protein: 6.8 g/dL (ref 6.5–8.1)

## 2020-04-30 LAB — HEMOGLOBIN A1C
Hgb A1c MFr Bld: 6.1 % — ABNORMAL HIGH (ref 4.8–5.6)
Mean Plasma Glucose: 128.37 mg/dL

## 2020-04-30 LAB — HIV ANTIBODY (ROUTINE TESTING W REFLEX): HIV Screen 4th Generation wRfx: NONREACTIVE

## 2020-04-30 LAB — BASIC METABOLIC PANEL
Anion gap: 16 — ABNORMAL HIGH (ref 5–15)
BUN: 18 mg/dL (ref 6–20)
CO2: 23 mmol/L (ref 22–32)
Calcium: 9.7 mg/dL (ref 8.9–10.3)
Chloride: 103 mmol/L (ref 98–111)
Creatinine, Ser: 1.12 mg/dL (ref 0.61–1.24)
GFR calc Af Amer: >60 mL/min (ref >60)
GFR calc non Af Amer: >60 mL/min (ref >60)
Glucose, Bld: 165 mg/dL — ABNORMAL HIGH (ref 70–99)
Potassium: 4.2 mmol/L (ref 3.5–5.1)
Sodium: 142 mmol/L (ref 135–145)

## 2020-04-30 LAB — RAPID URINE DRUG SCREEN, HOSP PERFORMED
Amphetamines: NOT DETECTED
Barbiturates: NOT DETECTED
Benzodiazepines: NOT DETECTED
Cocaine: POSITIVE — AB
Opiates: NOT DETECTED
Tetrahydrocannabinol: NOT DETECTED

## 2020-04-30 LAB — SARS CORONAVIRUS 2 BY RT PCR (HOSPITAL ORDER, PERFORMED IN ~~LOC~~ HOSPITAL LAB): SARS Coronavirus 2: NEGATIVE

## 2020-04-30 LAB — HEPARIN LEVEL (UNFRACTIONATED)
Heparin Unfractionated: 0.19 IU/mL — ABNORMAL LOW (ref 0.30–0.70)
Heparin Unfractionated: 0.25 IU/mL — ABNORMAL LOW (ref 0.30–0.70)

## 2020-04-30 LAB — GLUCOSE, CAPILLARY
Glucose-Capillary: 127 mg/dL — ABNORMAL HIGH (ref 70–99)
Glucose-Capillary: 140 mg/dL — ABNORMAL HIGH (ref 70–99)

## 2020-04-30 LAB — MRSA PCR SCREENING: MRSA by PCR: NEGATIVE

## 2020-04-30 LAB — TSH: TSH: 0.54 u[IU]/mL (ref 0.350–4.500)

## 2020-04-30 MED ORDER — INSULIN ASPART 100 UNIT/ML ~~LOC~~ SOLN
0.0000 [IU] | Freq: Three times a day (TID) | SUBCUTANEOUS | Status: DC
  Administered 2020-04-30 – 2020-05-02 (×3): 2 [IU] via SUBCUTANEOUS

## 2020-04-30 MED ORDER — LIDOCAINE VISCOUS HCL 2 % MT SOLN
15.0000 mL | Freq: Once | OROMUCOSAL | Status: AC
  Administered 2020-04-30: 15 mL via ORAL
  Filled 2020-04-30: qty 15

## 2020-04-30 MED ORDER — ASPIRIN EC 81 MG PO TBEC
81.0000 mg | DELAYED_RELEASE_TABLET | Freq: Every day | ORAL | Status: DC
  Administered 2020-05-01 – 2020-05-03 (×3): 81 mg via ORAL
  Filled 2020-04-30 (×3): qty 1

## 2020-04-30 MED ORDER — IOHEXOL 350 MG/ML SOLN
100.0000 mL | Freq: Once | INTRAVENOUS | Status: AC | PRN
  Administered 2020-04-30: 100 mL via INTRAVENOUS

## 2020-04-30 MED ORDER — ALUM & MAG HYDROXIDE-SIMETH 200-200-20 MG/5ML PO SUSP
30.0000 mL | Freq: Once | ORAL | Status: AC
  Administered 2020-04-30: 30 mL via ORAL
  Filled 2020-04-30: qty 30

## 2020-04-30 MED ORDER — MORPHINE SULFATE (PF) 2 MG/ML IV SOLN
2.0000 mg | INTRAVENOUS | Status: DC | PRN
  Administered 2020-04-30 – 2020-05-02 (×15): 2 mg via INTRAVENOUS
  Filled 2020-04-30 (×15): qty 1

## 2020-04-30 MED ORDER — INSULIN ASPART 100 UNIT/ML ~~LOC~~ SOLN: 0.0000 [IU] | Freq: Every day | SUBCUTANEOUS | Status: DC

## 2020-04-30 MED ORDER — ATORVASTATIN CALCIUM 40 MG PO TABS
40.0000 mg | ORAL_TABLET | Freq: Every day | ORAL | Status: DC
  Administered 2020-04-30 – 2020-05-02 (×3): 40 mg via ORAL
  Filled 2020-04-30 (×3): qty 1

## 2020-04-30 MED ORDER — HEPARIN BOLUS VIA INFUSION
4000.0000 [IU] | Freq: Once | INTRAVENOUS | Status: AC
  Administered 2020-04-30: 4000 [IU] via INTRAVENOUS
  Filled 2020-04-30: qty 4000

## 2020-04-30 MED ORDER — SODIUM CHLORIDE 0.9% FLUSH: 3.0000 mL | Freq: Once | INTRAVENOUS | Status: DC

## 2020-04-30 MED ORDER — SODIUM CHLORIDE 0.9% FLUSH: 3.0000 mL | INTRAVENOUS | Status: DC | PRN

## 2020-04-30 MED ORDER — HEPARIN BOLUS VIA INFUSION
1100.0000 [IU] | Freq: Once | INTRAVENOUS | Status: AC
  Administered 2020-04-30: 1100 [IU] via INTRAVENOUS
  Filled 2020-04-30: qty 1100

## 2020-04-30 MED ORDER — ASPIRIN 81 MG PO CHEW
324.0000 mg | CHEWABLE_TABLET | Freq: Once | ORAL | Status: AC
  Administered 2020-04-30: 324 mg via ORAL
  Filled 2020-04-30: qty 4

## 2020-04-30 MED ORDER — ACETAMINOPHEN 325 MG PO TABS
650.0000 mg | ORAL_TABLET | ORAL | Status: DC | PRN
  Administered 2020-05-01 – 2020-05-02 (×4): 650 mg via ORAL
  Filled 2020-04-30 (×4): qty 2

## 2020-04-30 MED ORDER — NICOTINE 14 MG/24HR TD PT24
14.0000 mg | MEDICATED_PATCH | Freq: Every day | TRANSDERMAL | Status: DC
  Administered 2020-04-30 – 2020-05-03 (×4): 14 mg via TRANSDERMAL
  Filled 2020-04-30 (×4): qty 1

## 2020-04-30 MED ORDER — NITROGLYCERIN 0.4 MG SL SUBL
0.4000 mg | SUBLINGUAL_TABLET | SUBLINGUAL | Status: DC | PRN
  Administered 2020-04-30 (×2): 0.4 mg via SUBLINGUAL
  Filled 2020-04-30 (×2): qty 1

## 2020-04-30 MED ORDER — HEPARIN (PORCINE) 25000 UT/250ML-% IV SOLN
1400.0000 [IU]/h | INTRAVENOUS | Status: DC
  Administered 2020-04-30: 1000 [IU]/h via INTRAVENOUS
  Administered 2020-05-01: 1400 [IU]/h via INTRAVENOUS
  Filled 2020-04-30 (×3): qty 250

## 2020-04-30 MED ORDER — NITROGLYCERIN 2 % TD OINT
1.0000 [in_us] | TOPICAL_OINTMENT | Freq: Once | TRANSDERMAL | Status: AC
  Administered 2020-04-30: 1 [in_us] via TOPICAL
  Filled 2020-04-30: qty 1

## 2020-04-30 MED ORDER — SODIUM CHLORIDE 0.9 % IV SOLN: 250.0000 mL | INTRAVENOUS | Status: DC | PRN

## 2020-04-30 MED ORDER — HEPARIN BOLUS VIA INFUSION
2000.0000 [IU] | Freq: Once | INTRAVENOUS | Status: AC
  Administered 2020-04-30: 2000 [IU] via INTRAVENOUS
  Filled 2020-04-30: qty 2000

## 2020-04-30 MED ORDER — ONDANSETRON HCL 4 MG/2ML IJ SOLN
4.0000 mg | Freq: Four times a day (QID) | INTRAMUSCULAR | Status: DC | PRN
  Administered 2020-04-30: 4 mg via INTRAVENOUS
  Filled 2020-04-30: qty 2

## 2020-04-30 MED ORDER — SODIUM CHLORIDE 0.9% FLUSH
3.0000 mL | Freq: Two times a day (BID) | INTRAVENOUS | Status: DC
  Administered 2020-04-30 – 2020-05-03 (×4): 3 mL via INTRAVENOUS

## 2020-04-30 MED ORDER — NITROGLYCERIN IN D5W 200-5 MCG/ML-% IV SOLN
0.0000 ug/min | INTRAVENOUS | Status: DC
  Administered 2020-04-30: 5 ug/min via INTRAVENOUS
  Filled 2020-04-30 (×2): qty 250
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
## 2020-04-30 NOTE — ED Notes (Signed)
Called to give report, nurse unavailable at this time. 
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
## 2020-04-30 NOTE — ED Notes (Signed)
Patient stating that his pain is a 10/10. Dr. Ophelia Charter aware of patient status and updated troponin level. Given Morphine for pain, See MAR.
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
## 2020-04-30 NOTE — Plan of Care (Signed)
  Problem: Education: Goal: Knowledge of General Education information will improve Description: Including pain rating scale, medication(s)/side effects and non-pharmacologic comfort measures Outcome: Progressing   Problem: Health Behavior/Discharge Planning: Goal: Ability to manage health-related needs will improve Outcome: Progressing   Problem: Clinical Measurements: Goal: Ability to maintain clinical measurements within normal limits will improve Outcome: Progressing   Problem: Clinical Measurements: Goal: Cardiovascular complication will be avoided Outcome: Progressing   Problem: Nutrition: Goal: Adequate nutrition will be maintained Outcome: Progressing   Problem: Coping: Goal: Level of anxiety will decrease Outcome: Progressing   Problem: Pain Managment: Goal: General experience of comfort will improve Outcome: Progressing   

## 2020-04-30 NOTE — ED Triage Notes (Signed)
The pt arrived by [facility] with chest pain after smoking crack earlier today ems gave the pt an iv  And gave him 324mg  aspirin nitro x1  With little relief   zofran 4mg  iv  n and v no sob  He uses crack cocaine  regularly

## 2020-04-30 NOTE — Progress Notes (Signed)
  Echocardiogram 2D Echocardiogram has been performed.  Delcie Roch 04/30/2020, 10:21 AM

## 2020-05-01 ENCOUNTER — Encounter (HOSPITAL_COMMUNITY)
Admission: EM | Disposition: A | Discharge status: Home / Self Care | Payer: Self-pay | Source: Home / Self Care | Attending: Internal Medicine

## 2020-05-01 DIAGNOSIS — R778 Other specified abnormalities of plasma proteins: Secondary | ICD-10-CM

## 2020-05-01 DIAGNOSIS — I2510 Atherosclerotic heart disease of native coronary artery without angina pectoris: Secondary | ICD-10-CM

## 2020-05-01 DIAGNOSIS — F1490 Cocaine use, unspecified, uncomplicated: Secondary | ICD-10-CM

## 2020-05-01 DIAGNOSIS — F332 Major depressive disorder, recurrent severe without psychotic features: Secondary | ICD-10-CM

## 2020-05-01 DIAGNOSIS — F1910 Other psychoactive substance abuse, uncomplicated: Secondary | ICD-10-CM

## 2020-05-01 HISTORY — PX: CORONARY STENT INTERVENTION: CATH118234

## 2020-05-01 HISTORY — PX: LEFT HEART CATH AND CORONARY ANGIOGRAPHY: CATH118249

## 2020-05-01 LAB — CBC
HCT: 41.9 % (ref 39.0–52.0)
Hemoglobin: 13.9 g/dL (ref 13.0–17.0)
MCH: 31.1 pg (ref 26.0–34.0)
MCHC: 33.2 g/dL (ref 30.0–36.0)
MCV: 93.7 fL (ref 80.0–100.0)
Platelets: 176 10*3/uL (ref 150–400)
RBC: 4.47 MIL/uL (ref 4.22–5.81)
RDW: 13.2 % (ref 11.5–15.5)
WBC: 15.8 10*3/uL — ABNORMAL HIGH (ref 4.0–10.5)
nRBC: 0 % (ref 0.0–0.2)

## 2020-05-01 LAB — LIPID PANEL
Cholesterol: 238 mg/dL — ABNORMAL HIGH (ref 0–200)
HDL: 43 mg/dL (ref >40)
LDL Cholesterol: 169 mg/dL — ABNORMAL HIGH (ref 0–99)
Total CHOL/HDL Ratio: 5.5 RATIO
Triglycerides: 132 mg/dL (ref <150)
VLDL: 26 mg/dL (ref 0–40)

## 2020-05-01 LAB — POCT ACTIVATED CLOTTING TIME
Activated Clotting Time: 257 seconds
Activated Clotting Time: 285 seconds

## 2020-05-01 LAB — BASIC METABOLIC PANEL
Anion gap: 9 (ref 5–15)
BUN: 17 mg/dL (ref 6–20)
CO2: 24 mmol/L (ref 22–32)
Calcium: 8.4 mg/dL — ABNORMAL LOW (ref 8.9–10.3)
Chloride: 102 mmol/L (ref 98–111)
Creatinine, Ser: 1.05 mg/dL (ref 0.61–1.24)
GFR calc Af Amer: >60 mL/min (ref >60)
GFR calc non Af Amer: >60 mL/min (ref >60)
Glucose, Bld: 127 mg/dL — ABNORMAL HIGH (ref 70–99)
Potassium: 3.5 mmol/L (ref 3.5–5.1)
Sodium: 135 mmol/L (ref 135–145)

## 2020-05-01 LAB — HEPARIN LEVEL (UNFRACTIONATED)
Heparin Unfractionated: 1.58 IU/mL — ABNORMAL HIGH (ref 0.30–0.70)
Heparin Unfractionated: 0.28 IU/mL — ABNORMAL LOW (ref 0.30–0.70)

## 2020-05-01 LAB — GLUCOSE, CAPILLARY
Glucose-Capillary: 135 mg/dL — ABNORMAL HIGH (ref 70–99)
Glucose-Capillary: 116 mg/dL — ABNORMAL HIGH (ref 70–99)
Glucose-Capillary: 95 mg/dL (ref 70–99)
Glucose-Capillary: 84 mg/dL (ref 70–99)

## 2020-05-01 LAB — PROTIME-INR
INR: 1.2 (ref 0.8–1.2)
Prothrombin Time: 14.3 seconds (ref 11.4–15.2)

## 2020-05-01 SURGERY — LEFT HEART CATH AND CORONARY ANGIOGRAPHY
Anesthesia: LOCAL

## 2020-05-01 MED ORDER — FENTANYL CITRATE (PF) 100 MCG/2ML IJ SOLN
INTRAMUSCULAR | Status: AC
  Filled 2020-05-01: qty 2

## 2020-05-01 MED ORDER — CLOPIDOGREL BISULFATE 300 MG PO TABS
ORAL_TABLET | ORAL | Status: AC
  Filled 2020-05-01: qty 1

## 2020-05-01 MED ORDER — MIDAZOLAM HCL 2 MG/2ML IJ SOLN
INTRAMUSCULAR | Status: DC | PRN
  Administered 2020-05-01: 1 mg via INTRAVENOUS

## 2020-05-01 MED ORDER — IOHEXOL 350 MG/ML SOLN
INTRAVENOUS | Status: DC | PRN
  Administered 2020-05-01: 65 mL

## 2020-05-01 MED ORDER — SODIUM CHLORIDE 0.9 % IV SOLN: 250.0000 mL | INTRAVENOUS | Status: DC | PRN

## 2020-05-01 MED ORDER — SODIUM CHLORIDE 0.9% FLUSH
3.0000 mL | Freq: Two times a day (BID) | INTRAVENOUS | Status: DC
  Administered 2020-05-01: 3 mL via INTRAVENOUS

## 2020-05-01 MED ORDER — SODIUM CHLORIDE 0.9% FLUSH: 3.0000 mL | INTRAVENOUS | Status: DC | PRN

## 2020-05-01 MED ORDER — IOHEXOL 350 MG/ML SOLN
INTRAVENOUS | Status: DC | PRN
  Administered 2020-05-01: 90 mL via INTRA_ARTERIAL

## 2020-05-01 MED ORDER — HEPARIN SODIUM (PORCINE) 1000 UNIT/ML IJ SOLN
INTRAMUSCULAR | Status: DC | PRN
  Administered 2020-05-01: 4000 [IU] via INTRAVENOUS

## 2020-05-01 MED ORDER — MIDAZOLAM HCL 2 MG/2ML IJ SOLN
INTRAMUSCULAR | Status: AC
  Filled 2020-05-01: qty 2

## 2020-05-01 MED ORDER — SODIUM CHLORIDE 0.9 % WEIGHT BASED INFUSION
1.0000 mL/kg/h | INTRAVENOUS | Status: AC
  Administered 2020-05-01: 1 mL/kg/h via INTRAVENOUS

## 2020-05-01 MED ORDER — HYDRALAZINE HCL 20 MG/ML IJ SOLN: 10.0000 mg | INTRAMUSCULAR | Status: AC | PRN

## 2020-05-01 MED ORDER — VERAPAMIL HCL 2.5 MG/ML IV SOLN: INTRAVENOUS | Status: DC | PRN

## 2020-05-01 MED ORDER — HEPARIN SODIUM (PORCINE) 1000 UNIT/ML IJ SOLN
INTRAMUSCULAR | Status: DC | PRN
  Administered 2020-05-01: 4000 [IU] via INTRAVENOUS
  Administered 2020-05-01: 2000 [IU] via INTRAVENOUS

## 2020-05-01 MED ORDER — CLOPIDOGREL BISULFATE 300 MG PO TABS
ORAL_TABLET | ORAL | Status: DC | PRN
  Administered 2020-05-01: 600 mg via ORAL

## 2020-05-01 MED ORDER — LIDOCAINE HCL (PF) 1 % IJ SOLN
INTRAMUSCULAR | Status: DC | PRN
  Administered 2020-05-01: 2 mL

## 2020-05-01 MED ORDER — NITROGLYCERIN 1 MG/10 ML FOR IR/CATH LAB
INTRA_ARTERIAL | Status: DC | PRN
  Administered 2020-05-01 (×2): 100 ug via INTRACORONARY

## 2020-05-01 MED ORDER — HEPARIN (PORCINE) IN NACL 1000-0.9 UT/500ML-% IV SOLN
INTRAVENOUS | Status: DC | PRN
  Administered 2020-05-01 (×4): 500 mL

## 2020-05-01 MED ORDER — ASPIRIN 81 MG PO CHEW: 81.0000 mg | CHEWABLE_TABLET | ORAL | Status: DC

## 2020-05-01 MED ORDER — FENTANYL CITRATE (PF) 100 MCG/2ML IJ SOLN
INTRAMUSCULAR | Status: DC | PRN
  Administered 2020-05-01 (×2): 25 ug via INTRAVENOUS

## 2020-05-01 MED ORDER — FENTANYL CITRATE (PF) 100 MCG/2ML IJ SOLN
INTRAMUSCULAR | Status: DC | PRN
  Administered 2020-05-01: 50 ug via INTRAVENOUS
  Administered 2020-05-01: 25 ug via INTRAVENOUS

## 2020-05-01 MED ORDER — NITROGLYCERIN 1 MG/10 ML FOR IR/CATH LAB
INTRA_ARTERIAL | Status: AC
  Filled 2020-05-01: qty 10

## 2020-05-01 MED ORDER — SODIUM CHLORIDE 0.9% FLUSH
3.0000 mL | Freq: Two times a day (BID) | INTRAVENOUS | Status: DC
  Administered 2020-05-02 (×2): 3 mL via INTRAVENOUS

## 2020-05-01 MED ORDER — CLOPIDOGREL BISULFATE 75 MG PO TABS
75.0000 mg | ORAL_TABLET | Freq: Every day | ORAL | Status: DC
  Administered 2020-05-02 – 2020-05-03 (×2): 75 mg via ORAL
  Filled 2020-05-01 (×2): qty 1

## 2020-05-01 MED ORDER — LABETALOL HCL 5 MG/ML IV SOLN: 10.0000 mg | INTRAVENOUS | Status: AC | PRN

## 2020-05-01 MED ORDER — SODIUM CHLORIDE 0.9 % IV SOLN
INTRAVENOUS | Status: DC
  Administered 2020-05-01: 650 mL via INTRAVENOUS

## 2020-05-01 MED ORDER — MIDAZOLAM HCL 2 MG/2ML IJ SOLN
INTRAMUSCULAR | Status: DC | PRN
  Administered 2020-05-01 (×2): 1 mg via INTRAVENOUS

## 2020-05-01 SURGICAL SUPPLY — 16 items
BALLN SAPPHIRE 2.5X12 (BALLOONS) ×2
BALLN SAPPHIRE ~~LOC~~ 3.0X18 (BALLOONS) ×1 IMPLANT
BALLOON SAPPHIRE 2.5X12 (BALLOONS) IMPLANT
CATH 5FR JL3.5 JR4 ANG PIG MP (CATHETERS) ×1 IMPLANT
CATH LAUNCHER 6FR EBU3.5 (CATHETERS) ×1 IMPLANT
DEVICE RAD COMP TR BAND LRG (VASCULAR PRODUCTS) ×1 IMPLANT
GLIDESHEATH SLEND SS 6F .021 (SHEATH) ×1 IMPLANT
GUIDEWIRE INQWIRE 1.5J.035X260 (WIRE) IMPLANT
INQWIRE 1.5J .035X260CM (WIRE) ×2
KIT ENCORE 26 ADVANTAGE (KITS) ×1 IMPLANT
KIT HEART LEFT (KITS) ×2 IMPLANT
PACK CARDIAC CATHETERIZATION (CUSTOM PROCEDURE TRAY) ×2 IMPLANT
STENT RESOLUTE ONYX 2.5X34 (Permanent Stent) ×1 IMPLANT
TRANSDUCER W/STOPCOCK (MISCELLANEOUS) ×2 IMPLANT
TUBING CIL FLEX 10 FLL-RA (TUBING) ×2 IMPLANT
WIRE COUGAR XT STRL 190CM (WIRE) ×1 IMPLANT

## 2020-05-01 NOTE — Plan of Care (Signed)
  Problem: Education: Goal: Knowledge of General Education information will improve Description: Including pain rating scale, medication(s)/side effects and non-pharmacologic comfort measures Outcome: Progressing   Problem: Health Behavior/Discharge Planning: Goal: Ability to manage health-related needs will improve Outcome: Progressing   Problem: Clinical Measurements: Goal: Ability to maintain clinical measurements within normal limits will improve Outcome: Progressing   Problem: Clinical Measurements: Goal: Cardiovascular complication will be avoided Outcome: Progressing   Problem: Nutrition: Goal: Adequate nutrition will be maintained Outcome: Progressing   Problem: Coping: Goal: Level of anxiety will decrease Outcome: Progressing   Problem: Pain Managment: Goal: General experience of comfort will improve Outcome: Progressing   
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
## 2020-05-01 NOTE — Progress Notes (Signed)
TR band removed from right radial site at 2130. Level 0, no signs of bruising,bleeding, hematoma noted. Gauze and Tegaderm dressing applied and educated patient to leave on for 24 hours and elevate extremity above heart level . VSS, will continue to monitor.
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
## 2020-05-01 NOTE — Interval H&P Note (Signed)
History and Physical Interval Note:  05/01/2020 2:56 PM  Ryan Blackburn  has presented today for surgery, with the diagnosis of chest pain.  The various methods of treatment have been discussed with the patient and family. After consideration of risks, benefits and other options for treatment, the patient has consented to  Procedure(s): LEFT HEART CATH AND CORONARY ANGIOGRAPHY (N/A) as a surgical intervention.  The patient's history has been reviewed, patient examined, no change in status, stable for surgery.  I have reviewed the patient's chart and labs.  Questions were answered to the patient's satisfaction.     Myles Mallicoat [employer]
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
## 2020-05-02 DIAGNOSIS — E785 Hyperlipidemia, unspecified: Secondary | ICD-10-CM

## 2020-05-02 LAB — GLUCOSE, CAPILLARY
Glucose-Capillary: 88 mg/dL (ref 70–99)
Glucose-Capillary: 127 mg/dL — ABNORMAL HIGH (ref 70–99)
Glucose-Capillary: 100 mg/dL — ABNORMAL HIGH (ref 70–99)
Glucose-Capillary: 89 mg/dL (ref 70–99)

## 2020-05-02 LAB — CBC
HCT: 39.8 % (ref 39.0–52.0)
Hemoglobin: 13.2 g/dL (ref 13.0–17.0)
MCH: 31.4 pg (ref 26.0–34.0)
MCHC: 33.2 g/dL (ref 30.0–36.0)
MCV: 94.5 fL (ref 80.0–100.0)
Platelets: 147 10*3/uL — ABNORMAL LOW (ref 150–400)
RBC: 4.21 MIL/uL — ABNORMAL LOW (ref 4.22–5.81)
RDW: 13.1 % (ref 11.5–15.5)
WBC: 11.9 10*3/uL — ABNORMAL HIGH (ref 4.0–10.5)
nRBC: 0 % (ref 0.0–0.2)

## 2020-05-02 MED ORDER — ATORVASTATIN CALCIUM 80 MG PO TABS
80.0000 mg | ORAL_TABLET | Freq: Every day | ORAL | Status: DC
  Administered 2020-05-03: 80 mg via ORAL
  Filled 2020-05-02: qty 1

## 2020-05-02 MED ORDER — ENOXAPARIN SODIUM 40 MG/0.4ML ~~LOC~~ SOLN
40.0000 mg | SUBCUTANEOUS | Status: DC
  Administered 2020-05-03: 40 mg via SUBCUTANEOUS
  Filled 2020-05-02: qty 0.4

## 2020-05-02 NOTE — Progress Notes (Signed)
CARDIAC REHAB PHASE I   PRE:  Rate/Rhythm: 76 SR    BP: sitting 111/65    SaO2: 92 RA  MODE:  Ambulation: 340 ft   POST:  Rate/Rhythm: 86 SR    BP: sitting 105/66     SaO2: 92 RA, 100 RA  Pt able to walk hall but did c/o SOB after walk. Noted SaO2 92 RA after walk and also with sitting at rest. Denied CP but sts he is sore. Discussed MI, stent, Plavix, restrictions, diet, exercise, smoking cessation, and CRPII. Pt fairly receptive but does ask that I call his dad and discuss education with him as well, which I did. Dad supportive and receptive. Will ensure his son takes his Plavix. Pt sts he can quit cocaine but smoking will be difficult. Gave him resources and tips. Will refer to [place] CRPII. [id number]  Harriet Masson CES, ACSM 05/02/2020 2:01 PM

## 2020-05-02 NOTE — Plan of Care (Signed)
  Problem: Education: Goal: Knowledge of General Education information will improve Description: Including pain rating scale, medication(s)/side effects and non-pharmacologic comfort measures Outcome: Progressing   Problem: Health Behavior/Discharge Planning: Goal: Ability to manage health-related needs will improve Outcome: Progressing   Problem: Clinical Measurements: Goal: Ability to maintain clinical measurements within normal limits will improve Outcome: Progressing   Problem: Clinical Measurements: Goal: Diagnostic test results will improve Outcome: Progressing   Problem: Clinical Measurements: Goal: Cardiovascular complication will be avoided Outcome: Progressing   Problem: Activity: Goal: Risk for activity intolerance will decrease Outcome: Progressing   Problem: Nutrition: Goal: Adequate nutrition will be maintained Outcome: Progressing   Problem: Coping: Goal: Level of anxiety will decrease Outcome: Progressing   Problem: Pain Managment: Goal: General experience of comfort will improve Outcome: Progressing   
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
## 2020-05-02 NOTE — TOC Progression Note (Addendum)
Transition of Care [facility]) - Progression Note    Patient Details  Name: Ryan Blackburn MRN: [id number] Date of Birth: 1981-10-05  Transition of [facility]) CM/SW Contact  Leone Haven, RN Phone Number: 05/02/2020, 11:06 AM  Clinical Narrative:    NCM spoke with patient and offered substance abuse resources, he refused he states he does not  Have any issues with any substance abuse.  Plan for dc tomorrow.           Expected Discharge Plan and Services                                                 Social Determinants of Health (SDOH) Interventions    Readmission Risk Interventions No flowsheet data found.

## 2020-05-02 NOTE — Plan of Care (Signed)
°  Problem: Education: Goal: Knowledge of General Education information will improve Description: Including pain rating scale, medication(s)/side effects and non-pharmacologic comfort measures Outcome: Progressing   Problem: Clinical Measurements: Goal: Will remain free from infection Outcome: Progressing Goal: Diagnostic test results will improve Outcome: Progressing Goal: Cardiovascular complication will be avoided Outcome: Progressing   Problem: Activity: Goal: Risk for activity intolerance will decrease Outcome: Progressing   Problem: Nutrition: Goal: Adequate nutrition will be maintained Outcome: Progressing   Problem: Pain Managment: Goal: General experience of comfort will improve Outcome: Progressing
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
## 2020-05-03 ENCOUNTER — Telehealth: Payer: Self-pay | Admitting: Cardiology

## 2020-05-03 DIAGNOSIS — F1490 Cocaine use, unspecified, uncomplicated: Secondary | ICD-10-CM

## 2020-05-03 DIAGNOSIS — Z955 Presence of coronary angioplasty implant and graft: Secondary | ICD-10-CM

## 2020-05-03 DIAGNOSIS — I214 Non-ST elevation (NSTEMI) myocardial infarction: Secondary | ICD-10-CM

## 2020-05-03 DIAGNOSIS — G40909 Epilepsy, unspecified, not intractable, without status epilepticus: Secondary | ICD-10-CM

## 2020-05-03 DIAGNOSIS — S069X5S Unspecified intracranial injury with loss of consciousness greater than 24 hours with return to pre-existing conscious level, sequela: Secondary | ICD-10-CM

## 2020-05-03 LAB — CBC WITH DIFFERENTIAL/PLATELET
Abs Immature Granulocytes: 0.03 10*3/uL (ref 0.00–0.07)
Basophils Absolute: 0 10*3/uL (ref 0.0–0.1)
Basophils Relative: 1 %
Eosinophils Absolute: 0.1 10*3/uL (ref 0.0–0.5)
Eosinophils Relative: 1 %
HCT: 40.3 % (ref 39.0–52.0)
Hemoglobin: 13.4 g/dL (ref 13.0–17.0)
Immature Granulocytes: 0 %
Lymphocytes Relative: 22 %
Lymphs Abs: 1.9 10*3/uL (ref 0.7–4.0)
MCH: 31.2 pg (ref 26.0–34.0)
MCHC: 33.3 g/dL (ref 30.0–36.0)
MCV: 93.7 fL (ref 80.0–100.0)
Monocytes Absolute: 0.9 10*3/uL (ref 0.1–1.0)
Monocytes Relative: 10 %
Neutro Abs: 5.9 10*3/uL (ref 1.7–7.7)
Neutrophils Relative %: 66 %
Platelets: 163 10*3/uL (ref 150–400)
RBC: 4.3 MIL/uL (ref 4.22–5.81)
RDW: 12.9 % (ref 11.5–15.5)
WBC: 8.8 10*3/uL (ref 4.0–10.5)
nRBC: 0 % (ref 0.0–0.2)

## 2020-05-03 LAB — COMPREHENSIVE METABOLIC PANEL
ALT: 28 U/L (ref 0–44)
AST: 32 U/L (ref 15–41)
Albumin: 2.9 g/dL — ABNORMAL LOW (ref 3.5–5.0)
Alkaline Phosphatase: 41 U/L (ref 38–126)
Anion gap: 9 (ref 5–15)
BUN: 18 mg/dL (ref 6–20)
CO2: 23 mmol/L (ref 22–32)
Calcium: 8.6 mg/dL — ABNORMAL LOW (ref 8.9–10.3)
Chloride: 106 mmol/L (ref 98–111)
Creatinine, Ser: 0.94 mg/dL (ref 0.61–1.24)
GFR calc Af Amer: >60 mL/min (ref >60)
GFR calc non Af Amer: >60 mL/min (ref >60)
Glucose, Bld: 109 mg/dL — ABNORMAL HIGH (ref 70–99)
Potassium: 3.6 mmol/L (ref 3.5–5.1)
Sodium: 138 mmol/L (ref 135–145)
Total Bilirubin: 0.6 mg/dL (ref 0.3–1.2)
Total Protein: 6.3 g/dL — ABNORMAL LOW (ref 6.5–8.1)

## 2020-05-03 LAB — GLUCOSE, CAPILLARY: Glucose-Capillary: 89 mg/dL (ref 70–99)

## 2020-05-03 LAB — MAGNESIUM: Magnesium: 2 mg/dL (ref 1.7–2.4)

## 2020-05-03 MED ORDER — ASPIRIN 81 MG PO TBEC: 81.0000 mg | DELAYED_RELEASE_TABLET | Freq: Every day | ORAL | 1 refills | Status: DC

## 2020-05-03 MED ORDER — NITROGLYCERIN 0.4 MG SL SUBL: 0.4000 mg | SUBLINGUAL_TABLET | SUBLINGUAL | 1 refills | Status: DC | PRN

## 2020-05-03 MED ORDER — CLOPIDOGREL BISULFATE 75 MG PO TABS: 75.0000 mg | ORAL_TABLET | Freq: Every day | ORAL | 1 refills | Status: DC

## 2020-05-03 MED ORDER — NICOTINE 14 MG/24HR TD PT24: 14.0000 mg | MEDICATED_PATCH | Freq: Every day | TRANSDERMAL | 0 refills | Status: DC

## 2020-05-03 MED ORDER — ATORVASTATIN CALCIUM 80 MG PO TABS: 80.0000 mg | ORAL_TABLET | Freq: Every day | ORAL | 1 refills | Status: DC
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
## 2020-05-03 NOTE — Discharge Summary (Signed)
Physician Discharge Summary  Ryan Blackburn JXB:[id number] DOB: 1981/06/27 DOA: 04/30/2020  PCP: Elizabeth Palau, FNP  Admit date: 04/30/2020 Discharge date: 05/03/2020  Time spent: 55 minutes  Recommendations for Outpatient Follow-up:  1. Follow-up with Dr. Anne Fu, cardiology in 2 weeks. 2. Follow-up with Elizabeth Palau, FNP in 2 weeks.  On follow-up patient will need a basic metabolic profile done to follow-up on electrolytes and renal function.    Discharge Diagnoses:  Principal Problem:   NSTEMI (non-ST elevated myocardial infarction) (HCC) Active Problems:   Seizure disorder (HCC)   TBI (traumatic brain injury) (HCC)   Major depressive disorder, recurrent severe without psychotic features (HCC)   Polysubstance abuse (HCC)   Discharge Condition: Stable and improved  Diet recommendation: Heart healthy  Filed Weights   04/30/20 0053 04/30/20 1230  Weight: 73.9 kg 86.8 kg    History of present illness:  HPI Per Dr. Thelma Barge is a 39 y.o. male with medical history significant of TBI; seizures; and substance abuse presenting with cocaine-induced chest pain.  He reported real bad chest pains.  He went out and smoked crack the night prior to admission, and he started with chest pain about 5pm the day prior to admission.  He had been smoking for several days.  Chest pain got worse and worse.  Substernal and radiated to the left side.  Also radiated up and down left arm.  Currently severe CP on left chest, unable to sleep.  No SOB.  He reported no use in >6 months until this episode, using for days.     ED Course:  Carryover, per Dr. Thomes Dinning:  Patient presented to emergency department due to chest pain. Troponin was noted to be elevated 995> 2120. CT the chest showed no acute findings. Chest x-ray showed mild airway thickening and increased interstitial opacities in lung bases suspicious to be due to bronchitic changes or intense inflammation related to cocaine use. Chest  pain improved with nitroglycerin, heparin drip started. Cardiology fellow consulted per ED physician and that cardiology a.m. team will follow up.   Hospital Course:  1 non-STEMI--cocaine induced Patient had presented with chest pain after going on a crack cocaine binge.  Patient was admitted chest x-ray done showed mild airway thickening and increased interstitial opacities in lung bases suspicious to be due to bronchitic changes or intense inflammation related to cocaine use.  CT angiogram chest and abdomen was done which was negative for any acute findings.  Cardiac enzymes were cycled which were elevated.  Cardiology was consulted and patient seen in consultation by Dr. Anne Fu.  2D echo was ordered.  Patient placed on IV heparin, aspirin, statin, IV nitroglycerin which was titrated as blood pressure would allow.  2D echo which was done with a EF of 60 to 65%, no wall motion abnormalities.  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  Cardiology recommended dual antiplatelet therapy with aspirin and Plavix for 12 months without interruption, medical adherence.  Patient also started on a statin of Lipitor.  Fasting lipid panel done had a LDL of 169.  Patient improved clinically.  Polysubstance abuse cessation stressed to patient.  Social work consulted to give patient information on polysubstance cessation.  Patient was assessed by cardiac rehab.  Patient was cleared by cardiology for discharge home with outpatient follow-up.  Patient be discharged in stable and improved condition with outpatient follow-up with cardiology.  2.  Hyperlipidemia Fasting lipid panel noted  to be 169.  Patient started on high-dose Lipitor 80 mg daily which will be discharged home on.  Outpatient follow-up.  3.  Polysubstance abuse Polysubstance cessation stressed to patient.  Patient did say he was motivated to quit using cocaine.   Patient stated he learned his lesson.  Social work was consulted to provide patient with information and help with polysubstance cessation.  4.  Seizure disorder  Patient was maintained on seizure precautions.  Patient noted to have Neurontin listed on medication however patient seemed uncertain of the dose.  Patient had no seizure episodes during the hospitalization.  Outpatient follow-up with PCP.  5.  Hyperglycemia Patient noted to have an A1c of 6.1.  Patient maintained on sliding scale insulin.  Outpatient follow-up with PCP.  6.  Leukocytosis Felt likely secondary to a reactive leukocytosis.  Patient remained afebrile.  Patient had no signs or symptoms of infection.  Leukocytosis had resolved by day of discharge.  7.  History of traumatic brain injury  8.  Major depressive disorder Remained stable.  Outpatient follow-up with PCP.   Procedures:  CT angiogram chest abdomen 04/30/2020  2D echo 04/30/2020 Cardiac catheterization 05/01/2020--Total occlusion of the left circumflex, treated successfully with PCI using a 2.5x34 mm Resolute Onyx DES post-dilated with a 3.0 mm Ravinia balloon. Initially there is 100% occlusion with TIMI-0 flow. Post-PCI there is 0% residual stenosis with TIMI-3 flow.----Per Dr. Excell Seltzerooper  Chest x-ray 04/30/2020    Consultations:  Cardiology: Dr. Anne FuSkains 04/30/2020  Discharge Exam: Vitals:   05/03/20 0423 05/03/20 0814  BP: 116/63 107/70  Pulse: 76 86  Resp: 18 13  Temp: 98.6 F (37 C) 99 F (37.2 C)  SpO2: 90% 94%    General: NAD Cardiovascular: RRR no murmurs rubs or gallops. Respiratory: Clear to auscultation bilaterally.  No wheezes, no crackles, no rhonchi.  Discharge Instructions   Discharge Instructions    Amb Referral to Cardiac Rehabilitation   Complete by: As directed    Diagnosis:  Coronary Stents PTCA NSTEMI     After initial evaluation and assessments completed: Virtual Based Care may be provided alone or in conjunction with Phase 2  Cardiac Rehab based on patient barriers.: Yes   Diet - low sodium heart healthy   Complete by: As directed    Increase activity slowly   Complete by: As directed      Allergies as of 05/03/2020      Reactions   Valproic Acid And Related Other (See Comments)   Developed high ammonia level after initiation of Valproic Acid   Dilantin [phenytoin Sodium Extended]    Rash   Keppra [levetiracetam]    dizziness   Mushroom Extract Complex    Hives    Penicillins Hives   Has patient had a PCN reaction causing immediate rash, facial/tongue/throat swelling, SOB or lightheadedness with hypotension: No Has patient had a PCN reaction causing severe rash involving mucus membranes or skin necrosis: No Has patient had a PCN reaction that required hospitalization: yes Has patient had a PCN reaction occurring within the last 10 years: No If all of the above answers are "NO", then may proceed with Cephalosporin use.   Zonegran [zonisamide] Other (See Comments)   Suicidal thoughts      Medication List    TAKE these medications   aspirin 81 MG EC tablet Take 1 tablet (81 mg total) by mouth daily. Start taking on: May 04, 2020   atorvastatin 80 MG tablet Commonly known as: LIPITOR Take 1 tablet (  80 mg total) by mouth daily. Start taking on: May 04, 2020   clopidogrel 75 MG tablet Commonly known as: PLAVIX Take 1 tablet (75 mg total) by mouth daily with breakfast. Start taking on: May 04, 2020   GABAPENTIN PO Take 2 capsules by mouth daily.   nicotine 14 mg/24hr patch Commonly known as: NICODERM CQ - dosed in mg/24 hours Place 1 patch (14 mg total) onto the skin daily. Start taking on: May 04, 2020   nitroGLYCERIN 0.4 MG SL tablet Commonly known as: NITROSTAT Place 1 tablet (0.4 mg total) under the tongue every 5 (five) minutes as needed for chest pain.      Allergies  Allergen Reactions  . Valproic Acid And Related Other (See Comments)    Developed high ammonia level after  initiation of Valproic Acid  . Dilantin [Phenytoin Sodium Extended]     Rash  . Keppra [Levetiracetam]     dizziness  . Mushroom Extract Complex     Hives   . Penicillins Hives    Has patient had a PCN reaction causing immediate rash, facial/tongue/throat swelling, SOB or lightheadedness with hypotension: No Has patient had a PCN reaction causing severe rash involving mucus membranes or skin necrosis: No Has patient had a PCN reaction that required hospitalization: yes Has patient had a PCN reaction occurring within the last 10 years: No If all of the above answers are "NO", then may proceed with Cephalosporin use.  Marland Kitchen Zonegran [Zonisamide] Other (See Comments)    Suicidal thoughts   Follow-up Information    Jake Bathe, MD. Schedule an appointment as soon as possible for a visit in 2 week(s).   Specialty: Cardiology Contact information: [address] [place] [address] [phone]        Elizabeth Palau, FNP. Schedule an appointment as soon as possible for a visit in 2 week(s).   Specialty: Nurse Practitioner Contact information: [address] [place] [place] [address] [phone]            The results of significant diagnostics from this hospitalization (including imaging, microbiology, ancillary and laboratory) are listed below for reference.    Significant Diagnostic Studies: DG Chest 2 View  Result Date: 04/30/2020 CLINICAL DATA:  Chest pain after smoking crack cocaine EXAM: CHEST - 2 VIEW COMPARISON:  Radiograph 02/01/2018 FINDINGS: Mild airways thickening and increased interstitial opacities in the lung bases. No focal consolidative opacities. No pneumothorax or effusion. The cardiomediastinal contours are unremarkable. No acute osseous or soft tissue abnormality. IMPRESSION: Mild airways thickening and increased interstitial opacities in the lung bases, could reflect bronchitic changes or interstitial inflammation related to  cocaine use. Electronically Signed   By: Kreg Shropshire M.D.   On: 04/30/2020 01:22   CARDIAC CATHETERIZATION  Result Date: 05/01/2020 Total occlusion of the left circumflex, treated successfully with PCI using a 2.5x34 mm Resolute Onyx DES post-dilated with a 3.0 mm Farrell balloon. Initially there is 100% occlusion with TIMI-0 flow. Post-PCI there is 0% residual stenosis with TIMI-3 flow. Recommend: DAPT with ASA and plavix x 12 months without interruption, medication adherence, social work consult prior to DC with hx crack cocaine use.   CARDIAC CATHETERIZATION  Result Date: 05/01/2020 Culprit for NSTEMI is occluded proximal LCx.  Patient continues to have chest pain, he is on NTG gtt.  Discussed with Dr. Excell Seltzer who will proceed with intervention.   ECHOCARDIOGRAM COMPLETE  Result Date: 04/30/2020    ECHOCARDIOGRAM REPORT   Patient Name:  Ryan Blackburn Date of Exam: 04/30/2020 Medical Rec #:  [id number]   Height:       68.0 in Accession #:    [id number]  Weight:       162.9 lb Date of Birth:  1981-09-24   BSA:          1.873 m Patient Age:    38 years    BP:           116/73 mmHg Patient Gender: M           HR:           62 bpm. Exam Location:  Inpatient Procedure: 2D Echo Indications:    abnormal ecg 794.31  History:        Patient has no prior history of Echocardiogram examinations.                 Signs/Symptoms:Chest Pain.  Sonographer:    Delcie Roch Referring Phys: [id number] DAYNA N DUNN IMPRESSIONS  1. Left ventricular ejection fraction, by estimation, is 60 to 65%. The left ventricle has normal function. The left ventricle has no regional wall motion abnormalities. Left ventricular diastolic parameters were normal.  2. Right ventricular systolic function is normal. The right ventricular size is normal.  3. The mitral valve is normal in structure. No evidence of mitral valve regurgitation. No evidence of mitral stenosis.  4. The aortic valve is normal in structure. Aortic valve regurgitation is not  visualized. No aortic stenosis is present.  5. The inferior vena cava is normal in size with greater than 50% respiratory variability, suggesting right atrial pressure of 3 mmHg. FINDINGS  Left Ventricle: Left ventricular ejection fraction, by estimation, is 60 to 65%. The left ventricle has normal function. The left ventricle has no regional wall motion abnormalities. The left ventricular internal cavity size was normal in size. There is  no left ventricular hypertrophy. Left ventricular diastolic parameters were normal. Right Ventricle: The right ventricular size is normal. No increase in right ventricular wall thickness. Right ventricular systolic function is normal. Left Atrium: Left atrial size was normal in size. Right Atrium: Right atrial size was normal in size. Pericardium: There is no evidence of pericardial effusion. Mitral Valve: The mitral valve is normal in structure. Normal mobility of the mitral valve leaflets. No evidence of mitral valve regurgitation. No evidence of mitral valve stenosis. Tricuspid Valve: The tricuspid valve is normal in structure. Tricuspid valve regurgitation is not demonstrated. No evidence of tricuspid stenosis. Aortic Valve: The aortic valve is normal in structure. Aortic valve regurgitation is not visualized. No aortic stenosis is present. Pulmonic Valve: The pulmonic valve was normal in structure. Pulmonic valve regurgitation is not visualized. No evidence of pulmonic stenosis. Aorta: The aortic root is normal in size and structure. Venous: The inferior vena cava is normal in size with greater than 50% respiratory variability, suggesting right atrial pressure of 3 mmHg. IAS/Shunts: No atrial level shunt detected by color flow Doppler.  LEFT VENTRICLE PLAX 2D LVIDd:         5.40 cm  Diastology LVIDs:         3.80 cm  LV e' lateral:   8.81 cm/s LV PW:         1.10 cm  LV E/e' lateral: 10.9 LV IVS:        0.90 cm  LV e' medial:    8.16 cm/s LVOT diam:     2.00 cm  LV E/e'  medial:  11.8 LV SV:  61 LV SV Index:   33 LVOT Area:     3.14 cm  RIGHT VENTRICLE            IVC RV S prime:     9.79 cm/s  IVC diam: 2.00 cm TAPSE (M-mode): 2.0 cm LEFT ATRIUM             Index       RIGHT ATRIUM           Index LA diam:        4.00 cm 2.14 cm/m  RA Area:     12.50 cm LA Vol (A2C):   37.1 ml 19.81 ml/m RA Volume:   27.70 ml  14.79 ml/m LA Vol (A4C):   41.2 ml 21.99 ml/m LA Biplane Vol: 40.3 ml 21.51 ml/m  AORTIC VALVE LVOT Vmax:   87.50 cm/s LVOT Vmean:  62.200 cm/s LVOT VTI:    0.195 m MITRAL VALVE MV Area (PHT): 3.77 cm    SHUNTS MV Decel Time: 201 msec    Systemic VTI:  0.20 m MV E velocity: 96.00 cm/s  Systemic Diam: 2.00 cm MV A velocity: 51.40 cm/s MV E/A ratio:  1.87 Donato Schultz MD Electronically signed by Donato Schultz MD Signature Date/Time: 04/30/2020/10:45:04 AM    Final    CT Angio Chest/Abd/Pel for Dissection W and/or Wo Contrast  Result Date: 04/30/2020 CLINICAL DATA:  Chest pain after smoking crack earlier today. Concern for aortic dissection. EXAM: CT ANGIOGRAPHY CHEST, ABDOMEN AND PELVIS TECHNIQUE: Non-contrast CT of the chest was initially obtained. Multidetector CT imaging through the chest, abdomen and pelvis was performed using the standard protocol during bolus administration of intravenous contrast. Multiplanar reconstructed images and MIPs were obtained and reviewed to evaluate the vascular anatomy. CONTRAST:  OMNIPAQUE IOHEXOL 350 MG/ML SOLN COMPARISON:  06/14/2014. FINDINGS: CTA CHEST FINDINGS Cardiovascular: Thoracic aorta is normal in caliber. No dissection. No atherosclerosis. Aortic arch branch vessels are widely patent. Heart is normal in size.  No pericardial effusion. Pulmonary arteries are relatively well opacified. No evidence of a central pulmonary embolus. Mediastinum/Nodes: No enlarged mediastinal, hilar, or axillary lymph nodes. Thyroid gland, trachea, and esophagus demonstrate no significant findings. Lungs/Pleura: Minor linear scarring  in the anterior right upper and middle lobes. Lungs otherwise clear. No pleural effusion or pneumothorax. Musculoskeletal: No chest wall abnormality. No acute or significant osseous findings. Review of the MIP images confirms the above findings. CTA ABDOMEN AND PELVIS FINDINGS VASCULAR Aorta: Normal in caliber. No dissection. Minor atherosclerosis sclerosis distally. No stenosis. Celiac: Patent without evidence of aneurysm, dissection, vasculitis or significant stenosis. SMA: Patent without evidence of aneurysm, dissection, vasculitis or significant stenosis. Renals: Both renal arteries are patent without evidence of aneurysm, dissection, vasculitis, fibromuscular dysplasia or significant stenosis. IMA: Patent without evidence of aneurysm, dissection, vasculitis or significant stenosis. Inflow: Minor atherosclerosis of the common iliac arteries. No stenosis or aneurysm. No dissection. Veins: No obvious venous abnormality within the limitations of this arterial phase study. Review of the MIP images confirms the above findings. NON-VASCULAR Hepatobiliary: No focal liver abnormality is seen. No gallstones, gallbladder wall thickening, or biliary dilatation. Pancreas: Unremarkable. No pancreatic ductal dilatation or surrounding inflammatory changes. Spleen: Normal in size without focal abnormality. Adrenals/Urinary Tract: Adrenal glands are unremarkable. Kidneys are normal, without renal calculi, focal lesion, or hydronephrosis. Bladder is unremarkable. Stomach/Bowel: Stomach is within normal limits. Appendix appears normal. No evidence of bowel wall thickening, distention, or inflammatory changes. Lymphatic: No enlarged lymph nodes. Reproductive: Normal. Other: No abdominal  wall hernia or abnormality. No abdominopelvic ascites. Musculoskeletal: No fracture or acute finding. No osteoblastic or osteolytic lesions. Disc degenerative changes at L5-S1. Review of the MIP images confirms the above findings. IMPRESSION: 1. No  acute findings.  No aortic dissection or aneurysm. 2. Minor atherosclerosis of the infrarenal abdominal aorta without stenosis. No other aortic abnormality. Aortic branch vessels are widely patent. 3. No acute findings in the chest. No findings to account for chest pain. No acute findings within the abdomen or pelvis. Electronically Signed   By: Lajean Manes M.D.   On: 04/30/2020 05:22    Microbiology: Recent Results (from the past 240 hour(s))  SARS Coronavirus 2 by RT PCR (hospital order, performed in [facility] hospital lab) Nasopharyngeal Nasopharyngeal Swab     Status: None   Collection Time: 04/30/20  3:39 AM   Specimen: Nasopharyngeal Swab  Result Value Ref Range Status   SARS Coronavirus 2 NEGATIVE NEGATIVE Final    Comment: (NOTE) SARS-CoV-2 target nucleic acids are NOT DETECTED. The SARS-CoV-2 RNA is generally detectable in upper and lower respiratory specimens during the acute phase of infection. The lowest concentration of SARS-CoV-2 viral copies this assay can detect is 250 copies / mL. A negative result does not preclude SARS-CoV-2 infection and should not be used as the sole basis for treatment or other patient management decisions.  A negative result may occur with improper specimen collection / handling, submission of specimen other than nasopharyngeal swab, presence of viral mutation(s) within the areas targeted by this assay, and inadequate number of viral copies (<250 copies / mL). A negative result must be combined with clinical observations, patient history, and epidemiological information. Fact Sheet for Patients:   StrictlyIdeas.no Fact Sheet for Healthcare Providers: BankingDealers.co.za This test is not yet approved or cleared  by the [place] FDA and has been authorized for detection and/or diagnosis of SARS-CoV-2 by FDA under an Emergency Use Authorization (EUA).  This EUA will remain in effect (meaning  this test can be used) for the duration of the COVID-19 declaration under Section 564(b)(1) of the Act, 21 U.S.C. section 360bbb-3(b)(1), unless the authorization is terminated or revoked sooner. Performed at [facility], [place] [address] [place], [place] [address]   MRSA PCR Screening     Status: None   Collection Time: 04/30/20  2:19 PM   Specimen: Nasal Mucosa; Nasopharyngeal  Result Value Ref Range Status   MRSA by PCR NEGATIVE NEGATIVE Final    Comment:        The GeneXpert MRSA Assay (FDA approved for NASAL specimens only), is one component of a comprehensive MRSA colonization surveillance program. It is not intended to diagnose MRSA infection nor to guide or monitor treatment for MRSA infections. Performed at [facility], [place] [address], [place], [place] [address]      Labs: Basic Metabolic Panel: Recent Labs  Lab 04/30/20 0056 05/01/20 0524 05/03/20 0944  NA 142 135 138  K 4.2 3.5 3.6  CL 103 102 106  CO2 23 24 23   GLUCOSE 165* 127* 109*  BUN 18 17 18   CREATININE 1.12 1.05 0.94  CALCIUM 9.7 8.4* 8.6*  MG  --   --  2.0   Liver Function Tests: Recent Labs  Lab 04/30/20 0910 05/03/20 0944  AST 154* 32  ALT 46* 28  ALKPHOS 47 41  BILITOT 0.6 0.6  PROT 6.8 6.3*  ALBUMIN 3.6 2.9*   No results for input(s): LIPASE, AMYLASE in the last 168  hours. No results for input(s): AMMONIA in the last 168 hours. CBC: Recent Labs  Lab 04/30/20 0056 05/01/20 0524 05/02/20 0311 05/03/20 0944  WBC 16.3* 15.8* 11.9* 8.8  NEUTROABS  --   --   --  5.9  HGB 15.3 13.9 13.2 13.4  HCT 45.8 41.9 39.8 40.3  MCV 94.0 93.7 94.5 93.7  PLT 245 176 147* 163   Cardiac Enzymes: No results for input(s): CKTOTAL, CKMB, CKMBINDEX, TROPONINI in the last 168 hours. BNP: BNP (last 3 results) No results for input(s): BNP in the last 8760 hours.  ProBNP (last 3 results) No results for input(s): PROBNP in the last 8760 hours.  CBG: Recent Labs  Lab  05/02/20 0602 05/02/20 1111 05/02/20 1657 05/02/20 2115 05/03/20 0615  GLUCAP 88 127* 100* 89 89       Signed:  Ramiro Harvest MD.  [facility] 05/03/2020, 10:59 AM

## 2020-05-03 NOTE — Telephone Encounter (Signed)
Patient does not live in [place] anymore and would like a referral for a Cardiologist near [place], [place]. The patient was treated by Dr. Anne Fu in the Hospital but would not like to come to [facility] for his appointments

## 2020-05-04 ENCOUNTER — Telehealth (HOSPITAL_COMMUNITY): Payer: Self-pay

## 2020-05-04 NOTE — Telephone Encounter (Signed)
Do you know anyone in this area to recommend to this patient?  [place], [place] appears to be close to the [place], [place] area.

## 2020-05-04 NOTE — Telephone Encounter (Signed)
Per phone note in pt chart from Dr. Anne Fu office pt is now living in [facility] and would like to find a dr closer. Pt does not wish to come to [facility] for his appts.  Will close referral

## 2020-05-04 NOTE — Telephone Encounter (Signed)
OK to send to [facility] cardiology. Donato Schultz, MD

## 2020-05-04 NOTE — Telephone Encounter (Signed)
Called to speak with pt regarding referral - pt is actual living in [facility] [place], [place] which is closer to [employer] area.  WIll ask Dr Anne Fu if he knows anyone there.

## 2020-05-05 NOTE — Telephone Encounter (Signed)
No one in particular.  OK to try [facility] heart offices.  Thanks  Donato Schultz, MD

## 2020-05-06 ENCOUNTER — Emergency Department (HOSPITAL_COMMUNITY): Payer: Medicare Other

## 2020-05-06 ENCOUNTER — Emergency Department (HOSPITAL_COMMUNITY)
Admission: EM | Admit: 2020-05-06 | Discharge: 2020-05-06 | Disposition: A | Discharge status: Home / Self Care | Payer: Medicare Other | Attending: Emergency Medicine | Admitting: Emergency Medicine

## 2020-05-06 ENCOUNTER — Emergency Department (HOSPITAL_BASED_OUTPATIENT_CLINIC_OR_DEPARTMENT_OTHER): Payer: Medicare Other

## 2020-05-06 ENCOUNTER — Encounter (HOSPITAL_COMMUNITY): Payer: Self-pay | Admitting: Emergency Medicine

## 2020-05-06 ENCOUNTER — Other Ambulatory Visit: Payer: Self-pay

## 2020-05-06 DIAGNOSIS — R079 Chest pain, unspecified: Secondary | ICD-10-CM | POA: Insufficient documentation

## 2020-05-06 DIAGNOSIS — I25119 Atherosclerotic heart disease of native coronary artery with unspecified angina pectoris: Secondary | ICD-10-CM | POA: Diagnosis not present

## 2020-05-06 DIAGNOSIS — Z79899 Other long term (current) drug therapy: Secondary | ICD-10-CM | POA: Insufficient documentation

## 2020-05-06 DIAGNOSIS — Z955 Presence of coronary angioplasty implant and graft: Secondary | ICD-10-CM | POA: Insufficient documentation

## 2020-05-06 DIAGNOSIS — I252 Old myocardial infarction: Secondary | ICD-10-CM | POA: Insufficient documentation

## 2020-05-06 DIAGNOSIS — F17210 Nicotine dependence, cigarettes, uncomplicated: Secondary | ICD-10-CM | POA: Diagnosis not present

## 2020-05-06 DIAGNOSIS — Z7982 Long term (current) use of aspirin: Secondary | ICD-10-CM | POA: Diagnosis not present

## 2020-05-06 DIAGNOSIS — Z7901 Long term (current) use of anticoagulants: Secondary | ICD-10-CM | POA: Diagnosis not present

## 2020-05-06 DIAGNOSIS — Z9861 Coronary angioplasty status: Secondary | ICD-10-CM | POA: Diagnosis not present

## 2020-05-06 LAB — BASIC METABOLIC PANEL
Anion gap: 9 (ref 5–15)
BUN: 20 mg/dL (ref 6–20)
CO2: 22 mmol/L (ref 22–32)
Calcium: 9.1 mg/dL (ref 8.9–10.3)
Chloride: 110 mmol/L (ref 98–111)
Creatinine, Ser: 0.96 mg/dL (ref 0.61–1.24)
GFR calc Af Amer: >60 mL/min (ref >60)
GFR calc non Af Amer: >60 mL/min (ref >60)
Glucose, Bld: 114 mg/dL — ABNORMAL HIGH (ref 70–99)
Potassium: 4 mmol/L (ref 3.5–5.1)
Sodium: 141 mmol/L (ref 135–145)

## 2020-05-06 LAB — CBC
HCT: 42.5 % (ref 39.0–52.0)
Hemoglobin: 13.9 g/dL (ref 13.0–17.0)
MCH: 30.9 pg (ref 26.0–34.0)
MCHC: 32.7 g/dL (ref 30.0–36.0)
MCV: 94.4 fL (ref 80.0–100.0)
Platelets: 259 10*3/uL (ref 150–400)
RBC: 4.5 MIL/uL (ref 4.22–5.81)
RDW: 13 % (ref 11.5–15.5)
WBC: 11.2 10*3/uL — ABNORMAL HIGH (ref 4.0–10.5)
nRBC: 0 % (ref 0.0–0.2)

## 2020-05-06 LAB — ECHOCARDIOGRAM COMPLETE
Height: 69 in
Weight: 3174.62 oz

## 2020-05-06 LAB — TROPONIN I (HIGH SENSITIVITY)
Troponin I (High Sensitivity): 2348 ng/L (ref <18)
Troponin I (High Sensitivity): 1896 ng/L (ref <18)
Troponin I (High Sensitivity): 1570 ng/L

## 2020-05-06 LAB — PROTIME-INR
INR: 1.1 (ref 0.8–1.2)
Prothrombin Time: 13.5 seconds (ref 11.4–15.2)

## 2020-05-06 MED ORDER — NITROGLYCERIN 0.4 MG SL SUBL: 0.4000 mg | SUBLINGUAL_TABLET | SUBLINGUAL | Status: DC | PRN

## 2020-05-06 MED ORDER — SODIUM CHLORIDE 0.9% FLUSH: 3.0000 mL | Freq: Once | INTRAVENOUS | Status: DC

## 2020-05-06 MED ORDER — ASPIRIN 81 MG PO CHEW
324.0000 mg | CHEWABLE_TABLET | Freq: Once | ORAL | Status: AC
  Administered 2020-05-06: 324 mg via ORAL
  Filled 2020-05-06: qty 4

## 2020-05-06 NOTE — Progress Notes (Signed)
  Echocardiogram 2D Echocardiogram has been performed.  Ryan Blackburn 05/06/2020, 10:51 AM

## 2020-05-06 NOTE — ED Notes (Signed)
ECHO in progress- 

## 2020-05-06 NOTE — Discharge Instructions (Addendum)
You are seen in the ER for chest pain.  You have been cleared by cardiology service right now.  Please return to the ER if you have worsening chest pain, shortness of breath, pain radiating to your jaw, shoulder, or back, sweats or fainting. Otherwise see the Cardiologist or your primary care doctor as requested.  Please continue take the medications have been prescribed and refrain from any illicit substance use.
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
## 2020-05-06 NOTE — ED Notes (Signed)
Critical trop reported from the lab, per documentation pt LWBS. This RN attempted to call pt to come back, spoke with friend/family who stated she would encourage pt to come back.

## 2020-05-06 NOTE — ED Triage Notes (Signed)
Patient reports left chest pain radiating to left arm with SOB and emesis onset yesterday , no diaphoresis or cough .

## 2020-05-06 NOTE — ED Notes (Signed)
IV attempted x1, butterfly attempted x1, both unsuccessful
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
## 2020-05-06 NOTE — ED Notes (Signed)
Cardiologist at bedside.  

## 2020-05-06 NOTE — ED Notes (Signed)
Pt said he could no longer wait

## 2020-05-06 NOTE — ED Notes (Signed)
Patient verbalizes understanding of discharge instructions. Opportunity for questioning and answers were provided. Armband removed by staff, pt discharged from ED.  

## 2020-05-06 NOTE — ED Provider Notes (Signed)
I assumed patient's care from Dr. Clerance Lav.  Patient had recent MI.  He is coming in with left-sided chest pain, that is different than his previous MI pain when I asked him about it.  His EKG is reassuring.  Cardiology has been consulted for elevated troponins that are downtrending. Patient is currently chest pain-free.  Reassessment: Cardiology service has assessed the patient.  They have cleared him for discharge given that the troponins are downtrending and likely from his recent cardiac procedure.  Patient remains chest pain-free. Strict ER return precautions have been discussed, and patient is agreeing with the plan and is comfortable with the workup done and the recommendations from the ER.    Ryan Kaplan, MD 05/06/20 1028

## 2020-08-06 ENCOUNTER — Other Ambulatory Visit: Payer: Self-pay

## 2020-08-06 ENCOUNTER — Emergency Department (HOSPITAL_COMMUNITY)
Admission: EM | Admit: 2020-08-06 | Discharge: 2020-08-06 | Disposition: A | Discharge status: Home / Self Care | Payer: Medicare Other | Attending: Emergency Medicine | Admitting: Emergency Medicine

## 2020-08-06 ENCOUNTER — Encounter (HOSPITAL_COMMUNITY): Payer: Self-pay | Admitting: Emergency Medicine

## 2020-08-06 DIAGNOSIS — R112 Nausea with vomiting, unspecified: Secondary | ICD-10-CM | POA: Diagnosis not present

## 2020-08-06 DIAGNOSIS — Z5321 Procedure and treatment not carried out due to patient leaving prior to being seen by health care provider: Secondary | ICD-10-CM | POA: Diagnosis not present

## 2020-08-06 DIAGNOSIS — R197 Diarrhea, unspecified: Secondary | ICD-10-CM | POA: Insufficient documentation

## 2020-08-06 DIAGNOSIS — R109 Unspecified abdominal pain: Secondary | ICD-10-CM | POA: Insufficient documentation

## 2020-08-06 LAB — CBC
HCT: 49.3 % (ref 39.0–52.0)
Hemoglobin: 16.2 g/dL (ref 13.0–17.0)
MCH: 30.7 pg (ref 26.0–34.0)
MCHC: 32.9 g/dL (ref 30.0–36.0)
MCV: 93.4 fL (ref 80.0–100.0)
Platelets: 248 10*3/uL (ref 150–400)
RBC: 5.28 MIL/uL (ref 4.22–5.81)
RDW: 13.8 % (ref 11.5–15.5)
WBC: 15.9 10*3/uL — ABNORMAL HIGH (ref 4.0–10.5)
nRBC: 0 % (ref 0.0–0.2)

## 2020-08-06 LAB — COMPREHENSIVE METABOLIC PANEL
ALT: 54 U/L — ABNORMAL HIGH (ref 0–44)
AST: 29 U/L (ref 15–41)
Albumin: 4.1 g/dL (ref 3.5–5.0)
Alkaline Phosphatase: 68 U/L (ref 38–126)
Anion gap: 11 (ref 5–15)
BUN: 13 mg/dL (ref 6–20)
CO2: 20 mmol/L — ABNORMAL LOW (ref 22–32)
Calcium: 9.4 mg/dL (ref 8.9–10.3)
Chloride: 109 mmol/L (ref 98–111)
Creatinine, Ser: 1.09 mg/dL (ref 0.61–1.24)
GFR calc Af Amer: >60 mL/min (ref >60)
GFR calc non Af Amer: >60 mL/min (ref >60)
Glucose, Bld: 139 mg/dL — ABNORMAL HIGH (ref 70–99)
Potassium: 3.8 mmol/L (ref 3.5–5.1)
Sodium: 140 mmol/L (ref 135–145)
Total Bilirubin: 0.7 mg/dL (ref 0.3–1.2)
Total Protein: 7.1 g/dL (ref 6.5–8.1)

## 2020-08-06 LAB — LIPASE, BLOOD: Lipase: 27 U/L (ref 11–51)

## 2020-08-06 NOTE — ED Notes (Signed)
Called pt name for VS recheck x3. No response from pt.  

## 2020-08-06 NOTE — ED Triage Notes (Signed)
C/o RLQ pain, nausea, vomiting, and diarrhea since 4am.

## 2020-08-08 ENCOUNTER — Other Ambulatory Visit: Payer: Medicare Other

## 2020-08-08 ENCOUNTER — Other Ambulatory Visit: Payer: Self-pay

## 2020-08-08 DIAGNOSIS — Z20822 Contact with and (suspected) exposure to covid-19: Secondary | ICD-10-CM

## 2020-08-10 LAB — NOVEL CORONAVIRUS, NAA: SARS-CoV-2, NAA: NOT DETECTED

## 2020-08-10 LAB — SARS-COV-2, NAA 2 DAY TAT

## 2021-10-15 ENCOUNTER — Emergency Department (HOSPITAL_COMMUNITY)
Admission: EM | Admit: 2021-10-15 | Discharge: 2021-10-16 | Disposition: A | Discharge status: Home / Self Care | Payer: Medicare Other | Attending: Student | Admitting: Student

## 2021-10-15 ENCOUNTER — Emergency Department (HOSPITAL_COMMUNITY): Payer: Medicare Other

## 2021-10-15 ENCOUNTER — Encounter (HOSPITAL_COMMUNITY): Payer: Self-pay | Admitting: Emergency Medicine

## 2021-10-15 ENCOUNTER — Other Ambulatory Visit: Payer: Self-pay

## 2021-10-15 DIAGNOSIS — R0602 Shortness of breath: Secondary | ICD-10-CM | POA: Insufficient documentation

## 2021-10-15 DIAGNOSIS — R0789 Other chest pain: Secondary | ICD-10-CM | POA: Insufficient documentation

## 2021-10-15 DIAGNOSIS — Z5321 Procedure and treatment not carried out due to patient leaving prior to being seen by health care provider: Secondary | ICD-10-CM | POA: Insufficient documentation

## 2021-10-15 LAB — TROPONIN I (HIGH SENSITIVITY): Troponin I (High Sensitivity): 6 ng/L (ref <18)

## 2021-10-15 LAB — CBC
HCT: 51.5 % (ref 39.0–52.0)
Hemoglobin: 16.9 g/dL (ref 13.0–17.0)
MCH: 31.2 pg (ref 26.0–34.0)
MCHC: 32.8 g/dL (ref 30.0–36.0)
MCV: 95.2 fL (ref 80.0–100.0)
Platelets: 218 10*3/uL (ref 150–400)
RBC: 5.41 MIL/uL (ref 4.22–5.81)
RDW: 13.8 % (ref 11.5–15.5)
WBC: 10.3 10*3/uL (ref 4.0–10.5)
nRBC: 0 % (ref 0.0–0.2)

## 2021-10-15 LAB — BASIC METABOLIC PANEL
Anion gap: 10 (ref 5–15)
BUN: 14 mg/dL (ref 6–20)
CO2: 27 mmol/L (ref 22–32)
Calcium: 9.4 mg/dL (ref 8.9–10.3)
Chloride: 105 mmol/L (ref 98–111)
Creatinine, Ser: 1.09 mg/dL (ref 0.61–1.24)
GFR, Estimated: >60 mL/min (ref >60)
Glucose, Bld: 105 mg/dL — ABNORMAL HIGH (ref 70–99)
Potassium: 4.3 mmol/L (ref 3.5–5.1)
Sodium: 142 mmol/L (ref 135–145)

## 2021-10-15 NOTE — ED Provider Notes (Signed)
Emergency Medicine Provider Triage Evaluation Note  Rourke Mcquitty , a 40 y.o. male  was evaluated in triage.  Pt complains of chest pain.  In the center of his chest, does not radiate.  No associated nausea, vomiting or shortness of breath.  Feels like tightness, history of previous MIs and this does not feel like it..  Review of Systems  Positive: Chest pain Negative: Shortness of breath  Physical Exam  Ht 5\' 9"  (1.753 m)   Wt 90 kg   BMI 29.30 kg/m  Gen:   Awake, no distress   Resp:  Normal effort  MSK:   Moves extremities without difficulty  Other:  S1-S2 with 2+ equal radial pulses  Medical Decision Making  Medically screening exam initiated at 8:23 PM.  Appropriate orders placed.  Renzo Vincelette was informed that the remainder of the evaluation will be completed by another provider, this initial triage assessment does not replace that evaluation, and the importance of remaining in the ED until their evaluation is complete.  Chest pain work-up   Arnold Long, Theron Arista 10/15/21 2024    2025, MD 10/15/21 [phone]

## 2021-10-15 NOTE — ED Triage Notes (Signed)
Patient reports central chest pain non radiating with tightness and SOB this evening , no emesis or diaphoresis.

## 2021-10-16 LAB — TROPONIN I (HIGH SENSITIVITY): Troponin I (High Sensitivity): 6 ng/L (ref <18)

## 2021-10-16 NOTE — ED Notes (Signed)
PT decided to leave AMA °

## 2022-02-13 ENCOUNTER — Other Ambulatory Visit: Payer: Self-pay

## 2022-02-13 ENCOUNTER — Emergency Department (HOSPITAL_COMMUNITY)
Admission: EM | Admit: 2022-02-13 | Discharge: 2022-02-13 | Disposition: A | Discharge status: Home / Self Care | Payer: 59 | Attending: Emergency Medicine | Admitting: Emergency Medicine

## 2022-02-13 ENCOUNTER — Emergency Department (HOSPITAL_COMMUNITY): Payer: 59

## 2022-02-13 DIAGNOSIS — Z7982 Long term (current) use of aspirin: Secondary | ICD-10-CM | POA: Diagnosis not present

## 2022-02-13 DIAGNOSIS — R0789 Other chest pain: Secondary | ICD-10-CM | POA: Diagnosis not present

## 2022-02-13 DIAGNOSIS — F17290 Nicotine dependence, other tobacco product, uncomplicated: Secondary | ICD-10-CM | POA: Insufficient documentation

## 2022-02-13 DIAGNOSIS — R062 Wheezing: Secondary | ICD-10-CM | POA: Insufficient documentation

## 2022-02-13 DIAGNOSIS — I2510 Atherosclerotic heart disease of native coronary artery without angina pectoris: Secondary | ICD-10-CM | POA: Insufficient documentation

## 2022-02-13 DIAGNOSIS — R079 Chest pain, unspecified: Secondary | ICD-10-CM | POA: Diagnosis present

## 2022-02-13 LAB — CBC WITH DIFFERENTIAL/PLATELET
Abs Immature Granulocytes: 0.02 10*3/uL (ref 0.00–0.07)
Basophils Absolute: 0.1 10*3/uL (ref 0.0–0.1)
Basophils Relative: 1 %
Eosinophils Absolute: 0.2 10*3/uL (ref 0.0–0.5)
Eosinophils Relative: 3 %
HCT: 47 % (ref 39.0–52.0)
Hemoglobin: 15.6 g/dL (ref 13.0–17.0)
Immature Granulocytes: 0 %
Lymphocytes Relative: 39 %
Lymphs Abs: 3.6 10*3/uL (ref 0.7–4.0)
MCH: 31.1 pg (ref 26.0–34.0)
MCHC: 33.2 g/dL (ref 30.0–36.0)
MCV: 93.6 fL (ref 80.0–100.0)
Monocytes Absolute: 0.7 10*3/uL (ref 0.1–1.0)
Monocytes Relative: 7 %
Neutro Abs: 4.6 10*3/uL (ref 1.7–7.7)
Neutrophils Relative %: 50 %
Platelets: 218 10*3/uL (ref 150–400)
RBC: 5.02 MIL/uL (ref 4.22–5.81)
RDW: 14 % (ref 11.5–15.5)
WBC: 9.1 10*3/uL (ref 4.0–10.5)
nRBC: 0 % (ref 0.0–0.2)

## 2022-02-13 LAB — BASIC METABOLIC PANEL
Anion gap: 9 (ref 5–15)
BUN: 15 mg/dL (ref 6–20)
CO2: 24 mmol/L (ref 22–32)
Calcium: 9.1 mg/dL (ref 8.9–10.3)
Chloride: 106 mmol/L (ref 98–111)
Creatinine, Ser: 0.91 mg/dL (ref 0.61–1.24)
GFR, Estimated: >60 mL/min (ref >60)
Glucose, Bld: 100 mg/dL — ABNORMAL HIGH (ref 70–99)
Potassium: 3.8 mmol/L (ref 3.5–5.1)
Sodium: 139 mmol/L (ref 135–145)

## 2022-02-13 LAB — TROPONIN I (HIGH SENSITIVITY)
Troponin I (High Sensitivity): 10 ng/L (ref <18)
Troponin I (High Sensitivity): 9 ng/L (ref <18)

## 2022-02-13 MED ORDER — ASPIRIN 325 MG PO TABS: 325.0000 mg | ORAL_TABLET | Freq: Every day | ORAL | Status: DC
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
## 2022-02-13 NOTE — ED Triage Notes (Signed)
Pt bib [facility] from home c/o CP onset about to 1 hr ago. Pt CP is central and non-radiating. Pt states he had a heart attack last year and a stent placed. EMS gave 2 nitro and 324mg  of aspirin enroute. Pt states it did not help. ? ?EMS vitals ?136/90 BP ?98 HR ?98% O2 ? ?

## 2022-02-13 NOTE — Discharge Instructions (Addendum)
You were seen today for chest pain.  Your work-up is reassuring today.  You do have a history of coronary artery disease.  Your ongoing smoking puts you at risk for further heart attacks.  You should consider quitting smoking.  Follow-up closely with your cardiologist ?

## 2022-05-29 IMAGING — CT CT ANGIO CHEST-ABD-PELV FOR DISSECTION W/ AND WO/W CM
2 of 7 series · 14 of 46 positions shown, 16 images · IV contrast (OMNI 350)
Comparison: 06/14/2014.

CLINICAL DATA: Chest pain after smoking crack earlier today.
Concern for aortic dissection.

EXAM:
CT ANGIOGRAPHY CHEST, ABDOMEN AND PELVIS
TECHNIQUE: Non-contrast CT of the chest was initially obtained.

[Series 7: dissection 2mm · axial · 0.90mm/px · z∈[-626,-52]mm · 11 of 323 slices shown, 13 images]
[im 18/323  soft-tissue]
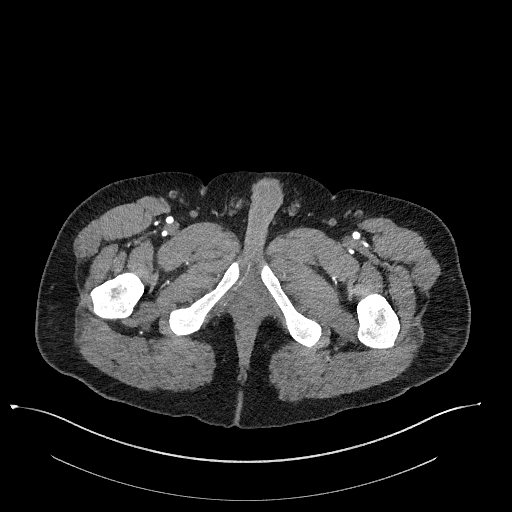
[im 18/323  bone]
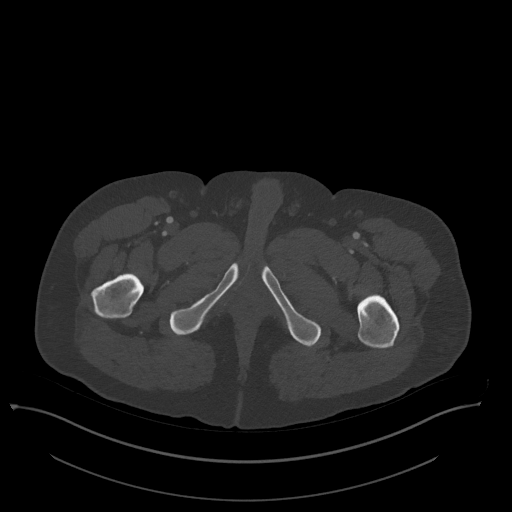
[im 54/323  soft-tissue]
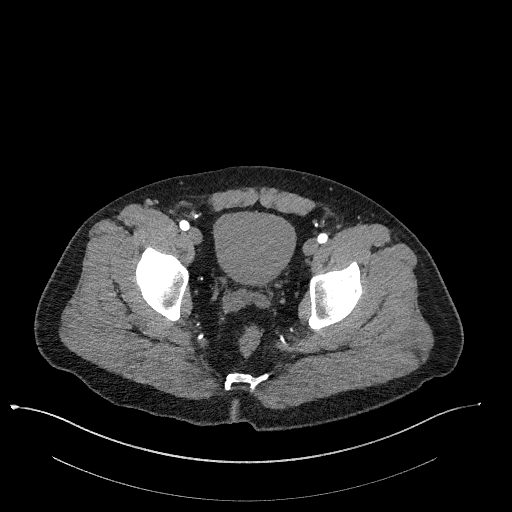
[im 72/323  soft-tissue]
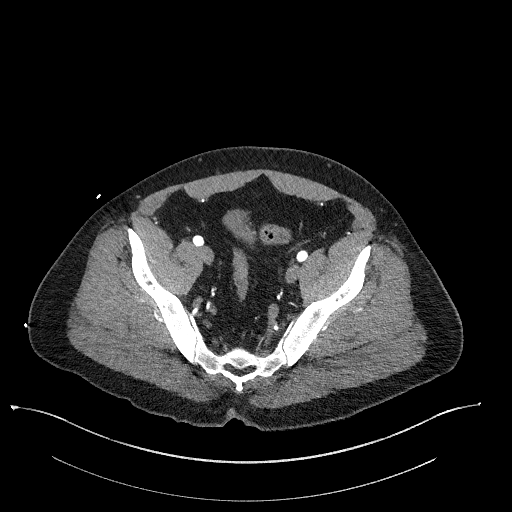
[im 108/323  soft-tissue]
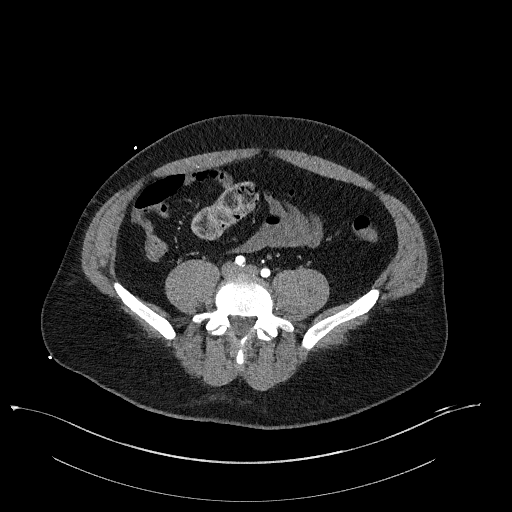
[im 126/323  soft-tissue]
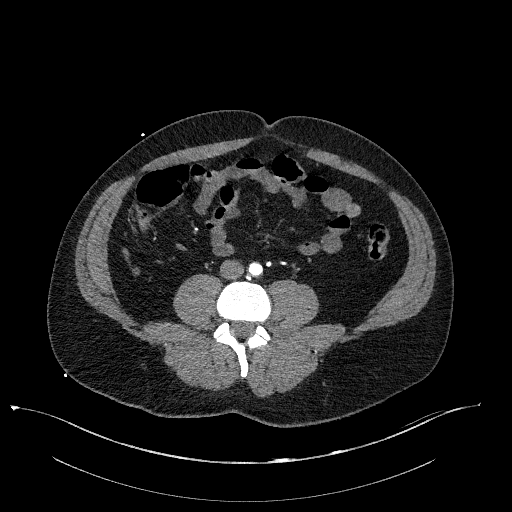
[im 162/323  soft-tissue]
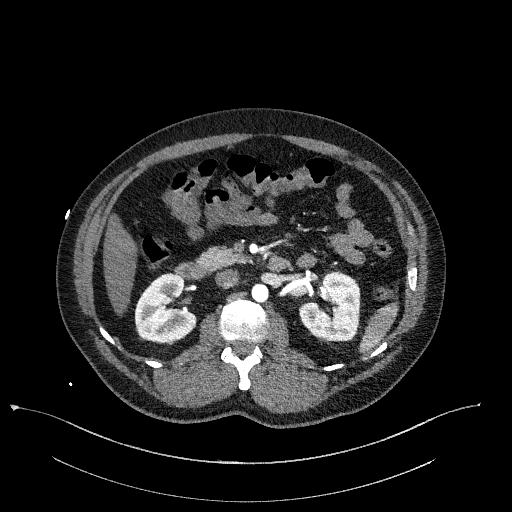
[im 197/323  soft-tissue]
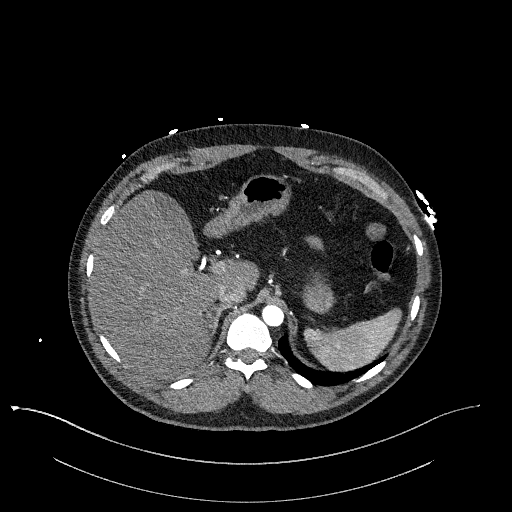
[im 215/323  soft-tissue]
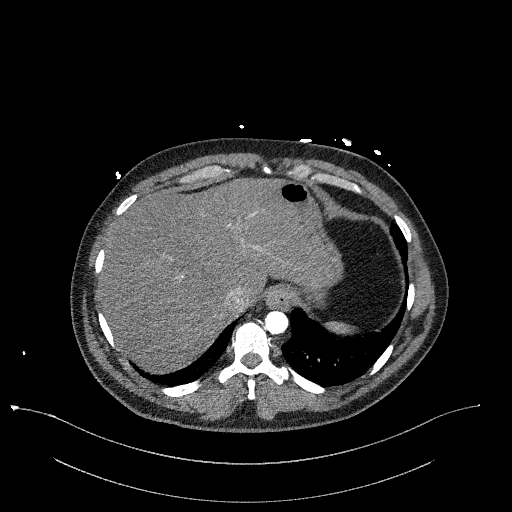
[im 251/323  soft-tissue]
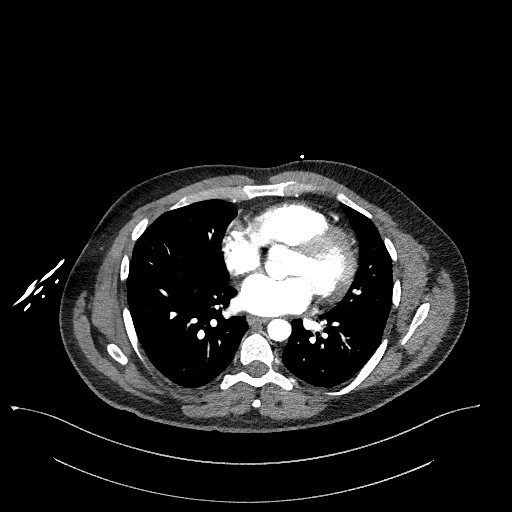
[im 251/323  bone]
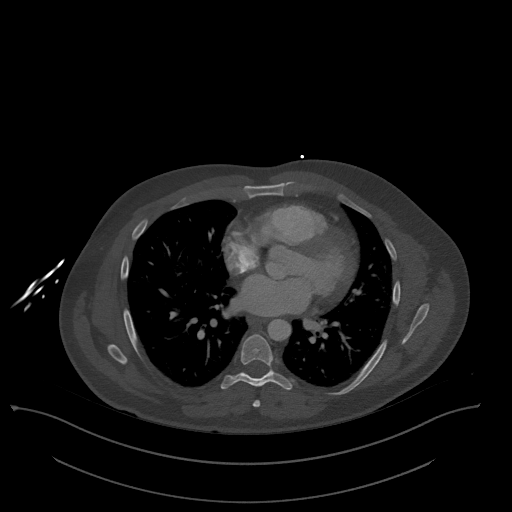
[im 269/323  soft-tissue]
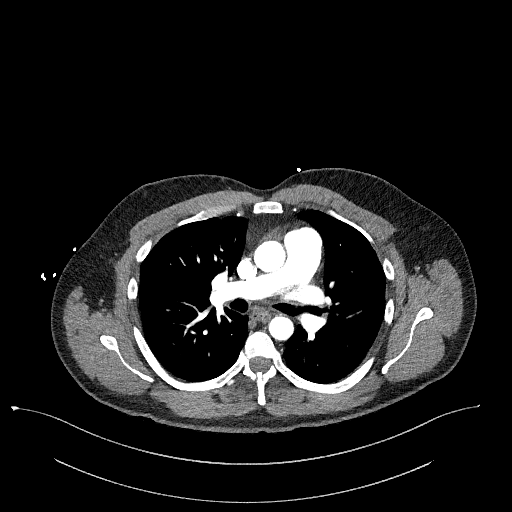
[im 305/323  soft-tissue]
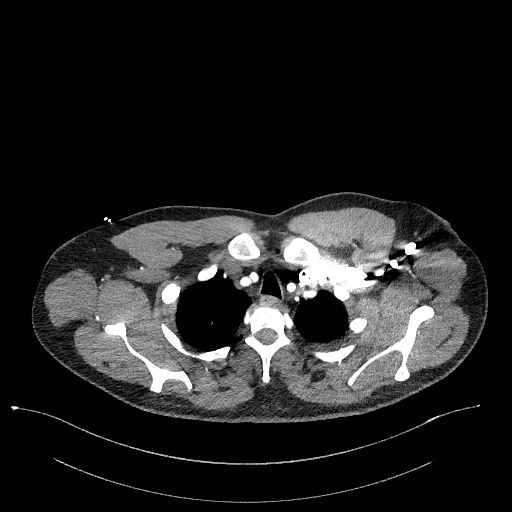

[Series 10: dissection 2mm cor · coronal · 0.79mm/px · 3 of 151 slices shown]
[im 38/151  soft-tissue]
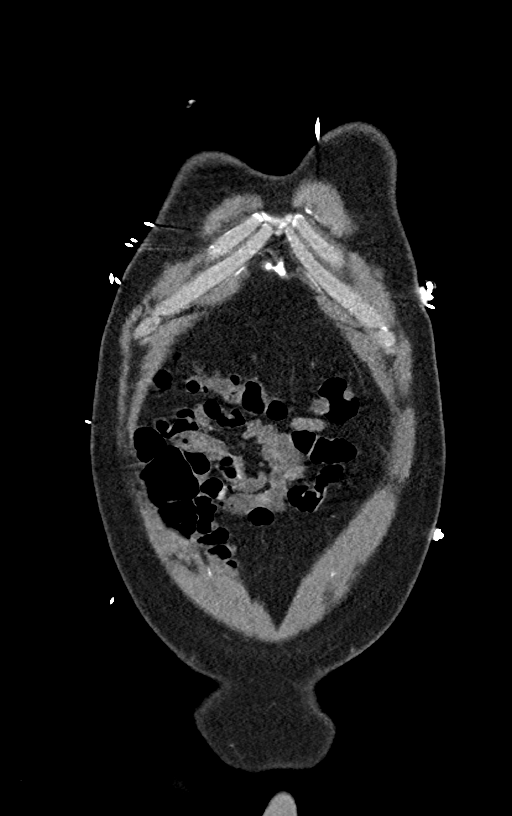
[im 76/151  soft-tissue]
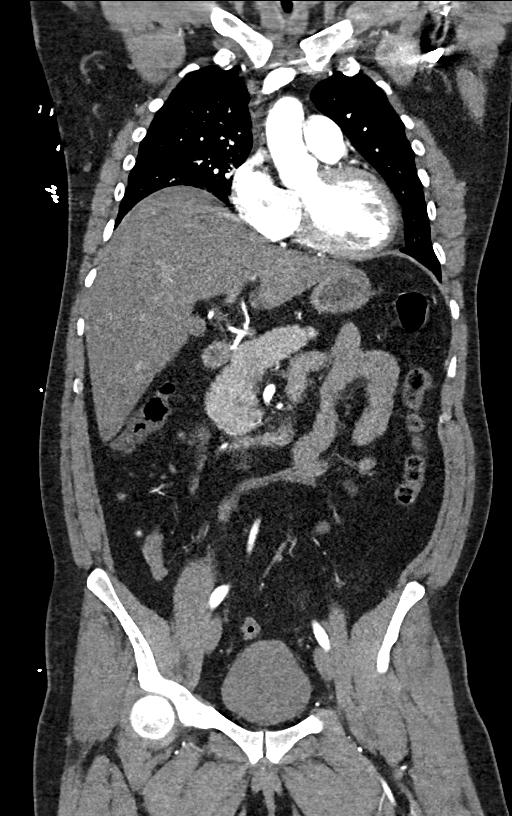
[im 113/151  soft-tissue]
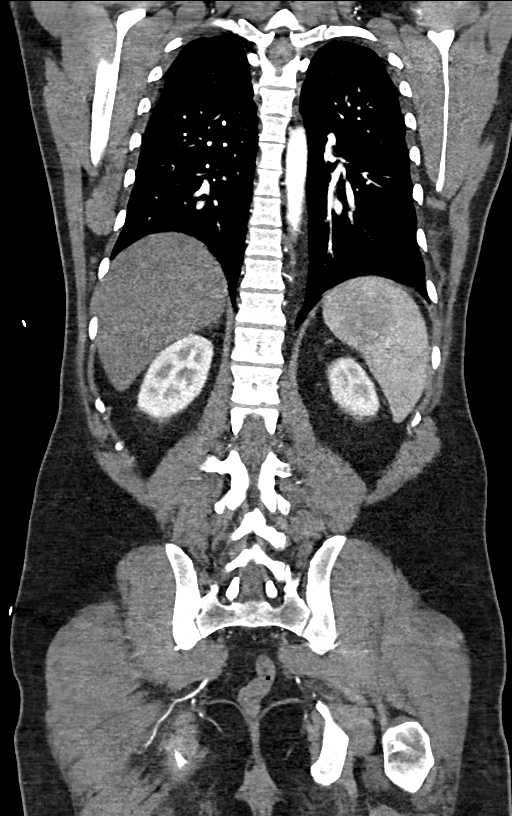

[14 of 46 positions shown; findings below may reference images not displayed]

Multidetector CT imaging through the chest, abdomen and pelvis was
performed using the standard protocol during bolus administration of
intravenous contrast. Multiplanar reconstructed images and MIPs were
obtained and reviewed to evaluate the vascular anatomy.

CONTRAST:  100mL OMNIPAQUE IOHEXOL 350 MG/ML SOLN
FINDINGS: CTA CHEST FINDINGS

Cardiovascular: Thoracic aorta is normal in caliber. No dissection.
No atherosclerosis. Aortic arch branch vessels are widely patent.

Heart is normal in size.  No pericardial effusion.

Pulmonary arteries are relatively well opacified. No evidence of a
central pulmonary embolus.

Mediastinum/Nodes: No enlarged mediastinal, hilar, or axillary lymph
nodes. Thyroid gland, trachea, and esophagus demonstrate no
significant findings.

Lungs/Pleura: Minor linear scarring in the anterior right upper and
middle lobes. Lungs otherwise clear. No pleural effusion or
pneumothorax.

Musculoskeletal: No chest wall abnormality. No acute or significant
osseous findings.

Review of the MIP images confirms the above findings.

CTA ABDOMEN AND PELVIS FINDINGS

VASCULAR

Aorta: Normal in caliber. No dissection. Minor atherosclerosis
sclerosis distally. No stenosis.

Celiac: Patent without evidence of aneurysm, dissection, vasculitis
or significant stenosis.

SMA: Patent without evidence of aneurysm, dissection, vasculitis or
significant stenosis.

Renals: Both renal arteries are patent without evidence of aneurysm,
dissection, vasculitis, fibromuscular dysplasia or significant
stenosis.

IMA: Patent without evidence of aneurysm, dissection, vasculitis or
significant stenosis.

Inflow: Minor atherosclerosis of the common iliac arteries. No
stenosis or aneurysm. No dissection.

Veins: No obvious venous abnormality within the limitations of this
arterial phase study.

Review of the MIP images confirms the above findings.

NON-VASCULAR

Hepatobiliary: No focal liver abnormality is seen. No gallstones,
gallbladder wall thickening, or biliary dilatation.

Pancreas: Unremarkable. No pancreatic ductal dilatation or
surrounding inflammatory changes.

Spleen: Normal in size without focal abnormality.

Adrenals/Urinary Tract: Adrenal glands are unremarkable. Kidneys are
normal, without renal calculi, focal lesion, or hydronephrosis.
Bladder is unremarkable.

Stomach/Bowel: Stomach is within normal limits. Appendix appears
normal. No evidence of bowel wall thickening, distention, or
inflammatory changes.

Lymphatic: No enlarged lymph nodes.

Reproductive: Normal.

Other: No abdominal wall hernia or abnormality. No abdominopelvic
ascites.

Musculoskeletal: No fracture or acute finding. No osteoblastic or
osteolytic lesions. Disc degenerative changes at L5-S1.

Review of the MIP images confirms the above findings.
IMPRESSION: 1. No acute findings.  No aortic dissection or aneurysm.
2. Minor atherosclerosis of the infrarenal abdominal aorta without
stenosis. No other aortic abnormality. Aortic branch vessels are
widely patent.
3. No acute findings in the chest. No findings to account for chest
pain. No acute findings within the abdomen or pelvis.

## 2022-12-04 ENCOUNTER — Encounter (HOSPITAL_COMMUNITY): Payer: Self-pay

## 2022-12-04 ENCOUNTER — Emergency Department (HOSPITAL_COMMUNITY)
Admission: EM | Admit: 2022-12-04 | Discharge: 2022-12-04 | Disposition: A | Discharge status: Home / Self Care | Payer: 59 | Attending: Emergency Medicine | Admitting: Emergency Medicine

## 2022-12-04 DIAGNOSIS — Z7982 Long term (current) use of aspirin: Secondary | ICD-10-CM | POA: Diagnosis not present

## 2022-12-04 DIAGNOSIS — S00211A Abrasion of right eyelid and periocular area, initial encounter: Secondary | ICD-10-CM | POA: Insufficient documentation

## 2022-12-04 DIAGNOSIS — S0990XA Unspecified injury of head, initial encounter: Secondary | ICD-10-CM

## 2022-12-04 DIAGNOSIS — W25XXXA Contact with sharp glass, initial encounter: Secondary | ICD-10-CM | POA: Insufficient documentation

## 2022-12-04 DIAGNOSIS — Z7901 Long term (current) use of anticoagulants: Secondary | ICD-10-CM | POA: Insufficient documentation

## 2022-12-04 NOTE — ED Notes (Signed)
Restraints removed. Pt placed in hand cuffs by sheriff and [employer] at bedside.

## 2022-12-04 NOTE — ED Notes (Addendum)
Pt ambulatory at time of discharge under PD custody. Pt continued to spit on departure.
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
## 2022-12-04 NOTE — ED Notes (Signed)
Per Sheriff at bedside pt remains in custody.

## 2023-05-22 ENCOUNTER — Other Ambulatory Visit: Payer: Self-pay

## 2023-05-22 ENCOUNTER — Emergency Department (HOSPITAL_COMMUNITY)
Admission: EM | Admit: 2023-05-22 | Discharge: 2023-05-22 | Disposition: A | Discharge status: Home / Self Care | Payer: 59 | Attending: Emergency Medicine | Admitting: Emergency Medicine

## 2023-05-22 ENCOUNTER — Emergency Department (HOSPITAL_COMMUNITY): Payer: 59

## 2023-05-22 ENCOUNTER — Encounter (HOSPITAL_COMMUNITY): Payer: Self-pay

## 2023-05-22 DIAGNOSIS — I2510 Atherosclerotic heart disease of native coronary artery without angina pectoris: Secondary | ICD-10-CM | POA: Diagnosis not present

## 2023-05-22 DIAGNOSIS — Z7982 Long term (current) use of aspirin: Secondary | ICD-10-CM | POA: Diagnosis not present

## 2023-05-22 DIAGNOSIS — R079 Chest pain, unspecified: Secondary | ICD-10-CM | POA: Diagnosis present

## 2023-05-22 DIAGNOSIS — Z87891 Personal history of nicotine dependence: Secondary | ICD-10-CM | POA: Diagnosis not present

## 2023-05-22 DIAGNOSIS — R071 Chest pain on breathing: Secondary | ICD-10-CM

## 2023-05-22 LAB — BASIC METABOLIC PANEL
Anion gap: 12 (ref 5–15)
BUN: 11 mg/dL (ref 6–20)
CO2: 27 mmol/L (ref 22–32)
Calcium: 9.4 mg/dL (ref 8.9–10.3)
Chloride: 103 mmol/L (ref 98–111)
Creatinine, Ser: 0.76 mg/dL (ref 0.61–1.24)
GFR, Estimated: >60 mL/min (ref >60)
Glucose, Bld: 101 mg/dL — ABNORMAL HIGH (ref 70–99)
Potassium: 4 mmol/L (ref 3.5–5.1)
Sodium: 142 mmol/L (ref 135–145)

## 2023-05-22 LAB — CBC
HCT: 41.3 % (ref 39.0–52.0)
Hemoglobin: 13.3 g/dL (ref 13.0–17.0)
MCH: 28.9 pg (ref 26.0–34.0)
MCHC: 32.2 g/dL (ref 30.0–36.0)
MCV: 89.6 fL (ref 80.0–100.0)
Platelets: 187 10*3/uL (ref 150–400)
RBC: 4.61 MIL/uL (ref 4.22–5.81)
RDW: 14 % (ref 11.5–15.5)
WBC: 5 10*3/uL (ref 4.0–10.5)
nRBC: 0 % (ref 0.0–0.2)

## 2023-05-22 LAB — TROPONIN I (HIGH SENSITIVITY)
Troponin I (High Sensitivity): 6 ng/L (ref <18)
Troponin I (High Sensitivity): 5 ng/L (ref <18)

## 2023-05-22 LAB — D-DIMER, QUANTITATIVE: D-Dimer, Quant: 0.33 ug/mL-FEU (ref 0.00–0.50)

## 2023-05-22 MED ORDER — KETOROLAC TROMETHAMINE 15 MG/ML IJ SOLN
15.0000 mg | Freq: Once | INTRAMUSCULAR | Status: AC
  Administered 2023-05-22: 15 mg via INTRAVENOUS
  Filled 2023-05-22: qty 1

## 2023-05-22 MED ORDER — NAPROXEN 375 MG PO TABS: 375.0000 mg | ORAL_TABLET | Freq: Two times a day (BID) | ORAL | 0 refills | Status: DC

## 2023-05-22 MED ORDER — MORPHINE SULFATE (PF) 4 MG/ML IV SOLN
4.0000 mg | Freq: Once | INTRAVENOUS | Status: AC
Start: 2023-05-22 — End: 2023-05-22
  Administered 2023-05-22: 4 mg via INTRAVENOUS
  Filled 2023-05-22: qty 1

## 2023-05-22 MED ORDER — MORPHINE SULFATE (PF) 4 MG/ML IV SOLN
4.0000 mg | Freq: Once | INTRAVENOUS | Status: AC
  Administered 2023-05-22: 4 mg via INTRAVENOUS
  Filled 2023-05-22: qty 1

## 2023-05-22 MED ORDER — ASPIRIN 81 MG PO CHEW
324.0000 mg | CHEWABLE_TABLET | Freq: Once | ORAL | Status: AC
  Administered 2023-05-22: 324 mg via ORAL
  Filled 2023-05-22: qty 4

## 2023-05-22 NOTE — Discharge Instructions (Signed)
The Tests today in the ED were reassuring.  No signs of heart attack or blood clot. Take anti-inflammatory medications as prescribed.  Follow up with a doctor/clinic to be rechecked

## 2023-05-22 NOTE — ED Triage Notes (Signed)
Per EMS and pt report, around 4pm the pt started having chest pain that he describes as "sharp, steady, and stabbing". Has a hx of STEMI. Pt states the pain started and then "was getting harder". At its worst, he rated the pain 9/10 but during triage he reports the pain as 4/10.

## 2023-05-22 NOTE — ED Provider Notes (Signed)
[place] EMERGENCY DEPARTMENT AT [facility] Provider Note   CSN: [id number] Arrival date & time: 05/22/23  1654     History  Chief Complaint  Patient presents with   Chest Pain    Ryan Blackburn is a 42 y.o. male.   Chest Pain    Patient has a history of traumatic brain injury, seizures anxiety, depression, tobacco abuse, cocaine abuse coronary artery disease with coronary stent.  Patient presents ED for evaluation of chest pain.  Patient states he started having sharp pain in his chest at about 4 PM.  It has been steady since then.  Patient states the pain increased in severity.  Denies any nausea.  No shortness of breath.  No leg swelling.  Home Medications Prior to Admission medications   Medication Sig Start Date End Date Taking? Authorizing Provider  naproxen (NAPROSYN) 375 MG tablet Take 1 tablet (375 mg total) by mouth 2 (two) times daily. 05/22/23  Yes Linwood Dibbles, MD  aspirin EC 81 MG EC tablet Take 1 tablet (81 mg total) by mouth daily. 05/04/20   Rodolph Bong, MD  atorvastatin (LIPITOR) 80 MG tablet Take 1 tablet (80 mg total) by mouth daily. 05/04/20   Rodolph Bong, MD  clopidogrel (PLAVIX) 75 MG tablet Take 1 tablet (75 mg total) by mouth daily with breakfast. 05/04/20   Rodolph Bong, MD  nicotine (NICODERM CQ - DOSED IN MG/24 HOURS) 14 mg/24hr patch Place 1 patch (14 mg total) onto the skin daily. Patient not taking: Reported on 05/06/2020 05/04/20   Rodolph Bong, MD  nitroGLYCERIN (NITROSTAT) 0.4 MG SL tablet Place 1 tablet (0.4 mg total) under the tongue every 5 (five) minutes as needed for chest pain. 05/03/20   Rodolph Bong, MD  escitalopram (LEXAPRO) 10 MG tablet Take 1 tablet (10 mg total) by mouth daily. Take half tablet for first 10 days and increase to 10mg  mg if tolerating. Patient not taking: Reported on 02/20/2016 12/08/15 02/20/16  Thresa Ross, MD      Allergies    Valproic acid and related, Dilantin [phenytoin sodium  extended], Keppra [levetiracetam], Mushroom extract complex, Penicillins, and Zonegran [zonisamide]    Review of Systems   Review of Systems  Cardiovascular:  Positive for chest pain.    Physical Exam Updated Vital Signs BP (!) 134/93 (BP Location: Right Arm)   Pulse (!) 57   Temp 98.4 F (36.9 C) (Oral)   Resp 15   Ht 1.753 m (5\' 9" )   Wt 77.1 kg   SpO2 94%   BMI 25.10 kg/m  Physical Exam Vitals and nursing note reviewed.  Constitutional:      General: He is not in acute distress.    Appearance: He is well-developed.  HENT:     Head: Normocephalic and atraumatic.     Right Ear: External ear normal.     Left Ear: External ear normal.  Eyes:     General: No scleral icterus.       Right eye: No discharge.        Left eye: No discharge.     Conjunctiva/sclera: Conjunctivae normal.  Neck:     Trachea: No tracheal deviation.  Cardiovascular:     Rate and Rhythm: Normal rate and regular rhythm.  Pulmonary:     Effort: Pulmonary effort is normal. No respiratory distress.     Breath sounds: Normal breath sounds. No stridor. No wheezing or rales.  Abdominal:     General: Bowel  sounds are normal. There is no distension.     Palpations: Abdomen is soft.     Tenderness: There is no abdominal tenderness. There is no guarding or rebound.  Musculoskeletal:        General: No tenderness or deformity.     Cervical back: Neck supple.  Skin:    General: Skin is warm and dry.     Findings: No rash.  Neurological:     General: No focal deficit present.     Mental Status: He is alert.     Cranial Nerves: No cranial nerve deficit, dysarthria or facial asymmetry.     Sensory: No sensory deficit.     Motor: No abnormal muscle tone or seizure activity.     Coordination: Coordination normal.  Psychiatric:        Mood and Affect: Mood normal.     ED Results / Procedures / Treatments   Labs (all labs ordered are listed, but only abnormal results are displayed) Labs Reviewed   BASIC METABOLIC PANEL - Abnormal; Notable for the following components:      Result Value   Glucose, Bld 101 (*)    All other components within normal limits  CBC  D-DIMER, QUANTITATIVE  CBG MONITORING, ED  TROPONIN I (HIGH SENSITIVITY)  TROPONIN I (HIGH SENSITIVITY)    EKG EKG Interpretation Date/Time:  Thursday May 22 2023 20:11:15 EDT Ventricular Rate:  56 PR Interval:  176 QRS Duration:  102 QT Interval:  460 QTC Calculation: 443 R Axis:   13  Text Interpretation: Sinus bradycardia Nonspecific T wave abnormality Abnormal ECG When compared with ECG of 22-May-2023 17:20, No significant change since last tracing Confirmed by Linwood Dibbles [phone]) on 05/22/2023 8:27:40 PM  Radiology DG Chest 1 View  Result Date: 05/22/2023 CLINICAL DATA:  [id number] Chest pain [id number] EXAM: CHEST  1 VIEW COMPARISON:  Chest x-ray 02/13/2022, CT chest 04/30/2020 FINDINGS: The heart and mediastinal contours are within normal limits. Low lung volumes. Question left hilar region couple patchy nodular-like airspace opacities. No focal consolidation. No pulmonary edema. No pleural effusion. No pneumothorax. No acute osseous abnormality. IMPRESSION: 1. Question left hilar region couple patchy nodular-like airspace opacities. Finding likely vessel en face in the setting of low lung volumes. 2. Recommend repeat PA and lateral view of the chest for further evaluation. Electronically Signed   By: Tish Frederickson M.D.   On: 05/22/2023 18:12    Procedures Procedures    Medications Ordered in ED Medications  aspirin chewable tablet 324 mg (324 mg Oral Given 05/22/23 1819)  ketorolac (TORADOL) 15 MG/ML injection 15 mg (15 mg Intravenous Given 05/22/23 1819)  morphine (PF) 4 MG/ML injection 4 mg (4 mg Intravenous Given 05/22/23 1819)  morphine (PF) 4 MG/ML injection 4 mg (4 mg Intravenous Given 05/22/23 2030)    ED Course/ Medical Decision Making/ A&P             HEART Score: 2                Medical Decision  Making Differential diagnosis includes but not limited to pneumonia pneumothorax pericarditis acute coronary syndrome, pulmonary embolism  Problems Addressed: Chest pain on breathing: acute illness or injury that poses a threat to life or bodily functions  Amount and/or Complexity of Data Reviewed Labs: ordered. Decision-making details documented in ED Course. Radiology: ordered and independent interpretation performed.  Risk OTC drugs. Prescription drug management.   Patient presents ED for evaluation of chest pain.  Patient does have  history of coronary artery disease.  Symptoms atypical for cardiac etiology.  It is pleuritic in nature.  D-dimer is negative.  Doubt pulmonary embolism.  Patient serial troponins are normal.  EKG is reassuring.  Suspect symptoms may be related to pleurisy or possibly pericarditis.  Will try him on a course of NSAIDs.  Patient appears stable for discharge and close outpatient follow-up.        Final Clinical Impression(s) / ED Diagnoses Final diagnoses:  Chest pain on breathing    Rx / DC Orders ED Discharge Orders          Ordered    naproxen (NAPROSYN) 375 MG tablet  2 times daily        05/22/23 2157              Linwood Dibbles, MD 05/22/23 2205

## 2023-09-12 ENCOUNTER — Emergency Department: Payer: 59

## 2023-09-12 ENCOUNTER — Emergency Department
Admission: EM | Admit: 2023-09-12 | Discharge: 2023-09-12 | Disposition: A | Discharge status: Home / Self Care | Payer: 59 | Attending: Emergency Medicine | Admitting: Emergency Medicine

## 2023-09-12 ENCOUNTER — Other Ambulatory Visit: Payer: Self-pay

## 2023-09-12 DIAGNOSIS — R1032 Left lower quadrant pain: Secondary | ICD-10-CM | POA: Diagnosis present

## 2023-09-12 DIAGNOSIS — G8918 Other acute postprocedural pain: Secondary | ICD-10-CM | POA: Diagnosis not present

## 2023-09-12 LAB — CBC WITH DIFFERENTIAL/PLATELET
Abs Immature Granulocytes: 0.02 10*3/uL (ref 0.00–0.07)
Basophils Absolute: 0 10*3/uL (ref 0.0–0.1)
Basophils Relative: 1 %
Eosinophils Absolute: 0.1 10*3/uL (ref 0.0–0.5)
Eosinophils Relative: 2 %
HCT: 44.8 % (ref 39.0–52.0)
Hemoglobin: 14.1 g/dL (ref 13.0–17.0)
Immature Granulocytes: 0 %
Lymphocytes Relative: 32 %
Lymphs Abs: 2.3 10*3/uL (ref 0.7–4.0)
MCH: 29.2 pg (ref 26.0–34.0)
MCHC: 31.5 g/dL (ref 30.0–36.0)
MCV: 92.8 fL (ref 80.0–100.0)
Monocytes Absolute: 0.5 10*3/uL (ref 0.1–1.0)
Monocytes Relative: 7 %
Neutro Abs: 4.2 10*3/uL (ref 1.7–7.7)
Neutrophils Relative %: 58 %
Platelets: 282 10*3/uL (ref 150–400)
RBC: 4.83 MIL/uL (ref 4.22–5.81)
RDW: 15.6 % — ABNORMAL HIGH (ref 11.5–15.5)
WBC: 7.2 10*3/uL (ref 4.0–10.5)
nRBC: 0 % (ref 0.0–0.2)

## 2023-09-12 LAB — URINALYSIS, W/ REFLEX TO CULTURE (INFECTION SUSPECTED)
Bilirubin Urine: NEGATIVE
Glucose, UA: NEGATIVE mg/dL
Ketones, ur: NEGATIVE mg/dL
Nitrite: NEGATIVE
Protein, ur: 30 mg/dL — AB
RBC / HPF: >50 RBC/hpf (ref 0–5)
Specific Gravity, Urine: 1.006 (ref 1.005–1.030)
Squamous Epithelial / HPF: 0 /[HPF] (ref 0–5)
WBC, UA: >50 WBC/hpf (ref 0–5)
pH: 8 (ref 5.0–8.0)

## 2023-09-12 LAB — COMPREHENSIVE METABOLIC PANEL
ALT: 25 U/L (ref 0–44)
AST: 21 U/L (ref 15–41)
Albumin: 4.3 g/dL (ref 3.5–5.0)
Alkaline Phosphatase: 71 U/L (ref 38–126)
Anion gap: 12 (ref 5–15)
BUN: 15 mg/dL (ref 6–20)
CO2: 28 mmol/L (ref 22–32)
Calcium: 9.6 mg/dL (ref 8.9–10.3)
Chloride: 103 mmol/L (ref 98–111)
Creatinine, Ser: 0.8 mg/dL (ref 0.61–1.24)
GFR, Estimated: >60 mL/min (ref >60)
Glucose, Bld: 140 mg/dL — ABNORMAL HIGH (ref 70–99)
Potassium: 4.2 mmol/L (ref 3.5–5.1)
Sodium: 143 mmol/L (ref 135–145)
Total Bilirubin: 0.6 mg/dL (ref 0.3–1.2)
Total Protein: 7.6 g/dL (ref 6.5–8.1)

## 2023-09-12 LAB — LACTIC ACID, PLASMA
Lactic Acid, Venous: 4.9 mmol/L (ref 0.5–1.9)
Lactic Acid, Venous: 2.1 mmol/L (ref 0.5–1.9)
Lactic Acid, Venous: 1.5 mmol/L (ref 0.5–1.9)

## 2023-09-12 MED ORDER — LEVOFLOXACIN 500 MG PO TABS
750.0000 mg | ORAL_TABLET | ORAL | Status: AC
  Administered 2023-09-12: 750 mg via ORAL
  Filled 2023-09-12: qty 2

## 2023-09-12 MED ORDER — CLOPIDOGREL BISULFATE 75 MG PO TABS: 75.0000 mg | ORAL_TABLET | Freq: Every day | ORAL | 1 refills | Status: DC

## 2023-09-12 MED ORDER — LEVOFLOXACIN 750 MG PO TABS: 750.0000 mg | ORAL_TABLET | Freq: Every day | ORAL | 0 refills | Status: AC

## 2023-09-12 MED ORDER — SODIUM CHLORIDE 0.9 % IV BOLUS
1000.0000 mL | Freq: Once | INTRAVENOUS | Status: AC
  Administered 2023-09-12: 1000 mL via INTRAVENOUS

## 2023-09-12 MED ORDER — IOHEXOL 300 MG/ML  SOLN
100.0000 mL | Freq: Once | INTRAMUSCULAR | Status: AC | PRN
  Administered 2023-09-12: 100 mL via INTRAVENOUS

## 2023-09-12 NOTE — ED Notes (Signed)
Lab called for blood draw.

## 2023-09-12 NOTE — ED Notes (Signed)
Lab at bedside

## 2023-09-12 NOTE — Plan of Care (Signed)
[facility] Tonsillectomy/Adenoidectomy, Postoperative PEDS care plan entered in error.

## 2023-09-12 NOTE — ED Provider Notes (Signed)
Procedures     ----------------------------------------- 7:21 PM on 09/12/2023 ----------------------------------------- Serial lactate is normalized.  Vital signs are normal, other labs are normal, creatinine is normal.  Patient reassessed, he is asymptomatic.  He does report that he never picked up the antibiotics that he was prescribed on discharge from [facility].  Reviewing the records, they wanted him to take Levaquin.  Patient requests prescription be [facility] on [address] in [place].  Also request a refill of his Plavix which she has run out of.  He is nontoxic, denies pain currently.  Not septic.  Stable for discharge.  Of note, patient reports he is dissatisfied with the urologist he was treated by at [facility], and refuses to go to a follow-up appointment in their office.  Will need to give contact information for local urology.     Sharman Cheek, MD 09/12/23 Ernestina Columbia

## 2023-09-12 NOTE — ED Provider Notes (Signed)
[facility] Provider Note    Event Date/Time   First MD Initiated Contact with Patient 09/12/23 1306     (approximate)   History   Post-op Problem   HPI  Ryan Blackburn is a 42 year old male presenting to the emergency department for evaluation of abdominal pain and postop evaluation.  He reports that 5 days ago he had some sort of kidney surgery at [facility].  He has sutures that he was told to have removed but he was unable to follow-up yesterday.  Reports ongoing left lower quadrant pain since his admission, not acutely worse today.  Has been peeing, but reports hematuria.  Reports a history of distant TBI so he is unable to recall further details about his admission.    Physical Exam   Triage Vital Signs: ED Triage Vitals  Encounter Vitals Group     BP 09/12/23 1158 (!) 156/111     Systolic BP Percentile --      Diastolic BP Percentile --      Pulse Rate 09/12/23 1158 96     Resp 09/12/23 1157 16     Temp 09/12/23 1157 98.3 F (36.8 C)     Temp Source 09/12/23 1157 Oral     SpO2 09/12/23 1158 99 %     Weight --      Height --      Head Circumference --      Peak Flow --      Pain Score 09/12/23 1157 7     Pain Loc --      Pain Education --      Exclude from Growth Chart --     Most recent vital signs: Vitals:   09/12/23 1158 09/12/23 1430  BP: (!) 156/111 114/76  Pulse: 96 61  Resp:  16  Temp:    SpO2: 99% 100%     General: Awake, interactive  CV:  Regular rate, good peripheral perfusion.  Resp:  Lungs clear, unlabored respirations.  Abd:  Soft, nondistended, tender to palpation left lower quadrant without rebound or guarding GU:  Suture present at the tip of the penis, no visible drainage in this area, not significantly swollen Neuro:  Symmetric facial movement, fluid speech   ED Results / Procedures / Treatments   Labs (all labs ordered are listed, but only abnormal results are displayed) Labs Reviewed   LACTIC ACID, PLASMA - Abnormal; Notable for the following components:      Result Value   Lactic Acid, Venous 4.9 (*)    All other components within normal limits  LACTIC ACID, PLASMA - Abnormal; Notable for the following components:   Lactic Acid, Venous 2.1 (*)    All other components within normal limits  COMPREHENSIVE METABOLIC PANEL - Abnormal; Notable for the following components:   Glucose, Bld 140 (*)    All other components within normal limits  CBC WITH DIFFERENTIAL/PLATELET - Abnormal; Notable for the following components:   RDW 15.6 (*)    All other components within normal limits  URINALYSIS, W/ REFLEX TO CULTURE (INFECTION SUSPECTED) - Abnormal; Notable for the following components:   Color, Urine RED (*)    APPearance HAZY (*)    Hgb urine dipstick LARGE (*)    Protein, ur 30 (*)    Leukocytes,Ua LARGE (*)    Bacteria, UA RARE (*)    All other components within normal limits  URINE CULTURE  CULTURE, BLOOD (SINGLE)     EKG EKG independently  reviewed interpreted by myself (ER attending) demonstrates:    RADIOLOGY Imaging independently reviewed and interpreted by myself demonstrates:  CT abdomen pelvis pending at time of signout  PROCEDURES:  Critical Care performed: No  Procedures   MEDICATIONS ORDERED IN ED: Medications  sodium chloride 0.9 % bolus 1,000 mL (has no administration in time range)  sodium chloride 0.9 % bolus 1,000 mL (1,000 mLs Intravenous New Bag/Given 09/12/23 1436)  iohexol (OMNIPAQUE) 300 MG/ML solution 100 mL (100 mLs Intravenous Contrast Given 09/12/23 1502)     IMPRESSION / MDM / ASSESSMENT AND PLAN / ED COURSE  I reviewed the triage vital signs and the nursing notes.  Differential diagnosis includes, but is not limited to, normal postoperative course, postoperative complication, UTI, pyelonephritis, renal stone  Patient's presentation is most consistent with acute presentation with potential threat to life or bodily  function.  42 year old male presenting to the emergency department after recent GU procedure, limited initial details available.  No records in care everywhere.  I was able to have our secretary request records from outside hospital.  Briefly reviewed discharge summary.  Patient presented with left-sided pain found to have pyelonephritis with obstructive uropathy for which he underwent cystoscopy, laser ablation and stent placement with suture with plans for urology follow-up as an outpatient.  Labs here from triage notable for elevated lactate at 4.9.  Ordered for IV fluids.  Repeat lactate prior to fluid resuscitation did improved to 2.1.  CT abdomen pelvis pending.  Signed out to oncoming provider at 1530 pending CT, reevaluation, and disposition.      FINAL CLINICAL IMPRESSION(S) / ED DIAGNOSES   Final diagnoses:  Post-operative pain  LLQ pain     Rx / DC Orders   ED Discharge Orders     None        Note:  This document was prepared using Dragon voice recognition software and may include unintentional dictation errors.   Trinna Post, MD 09/12/23 [phone]

## 2023-09-12 NOTE — ED Triage Notes (Signed)
States had a kidney surgery about 5 days ago at [facility].  Needs to have strings removed. States was supposed to follow up yesterday but did not have a ride.  C/O LLQ pain radiates to penis.  C/O hematuria and pain with urination since procedure.

## 2023-09-17 LAB — CULTURE, BLOOD (SINGLE): Culture: NO GROWTH

## 2023-09-24 ENCOUNTER — Telehealth: Payer: Self-pay | Admitting: Emergency Medicine

## 2023-09-24 NOTE — Telephone Encounter (Addendum)
[facility] and says that [facility] so he never took it.  He says "that thing is still in me"   and thinks he has bad infection.  Says it burns when he urinates.  I explained that since he was seen so long ago, andhe is still sick--he should be seen by a doctor again.  I asked about follow up appointments and he said he did not know what to do because he has tbi.  I explained that he has instructions with numbers to call for appointments.  He then said he cannot read.  I told him that it said he did not want to go to the urologist he saw before and that we gave him the number for a different urologist.  He then said he had called them, but he did not have his medicaid and medicare info and did not know how to get it.  I again advised him to be seen either in urgent care or ED.  He then says he has to wait until he gets his money next month because he doesn't have a ride.  I asked ifhe has a caregiver, and he says no.  Says he lives with another disabled person.  I again told him that he really needs to be seen.  He then hung up on me.

## 2023-10-31 DIAGNOSIS — R45851 Suicidal ideations: Secondary | ICD-10-CM | POA: Diagnosis not present

## 2023-11-03 ENCOUNTER — Other Ambulatory Visit: Payer: Self-pay

## 2023-11-03 ENCOUNTER — Inpatient Hospital Stay (HOSPITAL_COMMUNITY)
Admission: AD | Admit: 2023-11-03 | Discharge: 2023-11-07 | DRG: 885 | Disposition: A | Discharge status: Home / Self Care | Payer: 59 | Source: Other Acute Inpatient Hospital | Attending: Psychiatry | Admitting: Psychiatry

## 2023-11-03 ENCOUNTER — Encounter (HOSPITAL_COMMUNITY): Payer: Self-pay | Admitting: Psychiatry

## 2023-11-03 DIAGNOSIS — F1510 Other stimulant abuse, uncomplicated: Secondary | ICD-10-CM | POA: Diagnosis present

## 2023-11-03 DIAGNOSIS — F1123 Opioid dependence with withdrawal: Secondary | ICD-10-CM | POA: Diagnosis not present

## 2023-11-03 DIAGNOSIS — Z635 Disruption of family by separation and divorce: Secondary | ICD-10-CM

## 2023-11-03 DIAGNOSIS — Z91018 Allergy to other foods: Secondary | ICD-10-CM

## 2023-11-03 DIAGNOSIS — R319 Hematuria, unspecified: Secondary | ICD-10-CM | POA: Diagnosis present

## 2023-11-03 DIAGNOSIS — Z79899 Other long term (current) drug therapy: Secondary | ICD-10-CM | POA: Diagnosis not present

## 2023-11-03 DIAGNOSIS — F17210 Nicotine dependence, cigarettes, uncomplicated: Secondary | ICD-10-CM | POA: Diagnosis present

## 2023-11-03 DIAGNOSIS — Z9151 Personal history of suicidal behavior: Secondary | ICD-10-CM

## 2023-11-03 DIAGNOSIS — T39012A Poisoning by aspirin, intentional self-harm, initial encounter: Secondary | ICD-10-CM | POA: Diagnosis present

## 2023-11-03 DIAGNOSIS — Z5320 Procedure and treatment not carried out because of patient's decision for unspecified reasons: Secondary | ICD-10-CM | POA: Diagnosis present

## 2023-11-03 DIAGNOSIS — Z960 Presence of urogenital implants: Secondary | ICD-10-CM | POA: Diagnosis present

## 2023-11-03 DIAGNOSIS — F1124 Opioid dependence with opioid-induced mood disorder: Secondary | ICD-10-CM | POA: Diagnosis present

## 2023-11-03 DIAGNOSIS — R300 Dysuria: Secondary | ICD-10-CM | POA: Diagnosis present

## 2023-11-03 DIAGNOSIS — G43909 Migraine, unspecified, not intractable, without status migrainosus: Secondary | ICD-10-CM | POA: Diagnosis present

## 2023-11-03 DIAGNOSIS — Z91199 Patient's noncompliance with other medical treatment and regimen due to unspecified reason: Secondary | ICD-10-CM

## 2023-11-03 DIAGNOSIS — Z83438 Family history of other disorder of lipoprotein metabolism and other lipidemia: Secondary | ICD-10-CM

## 2023-11-03 DIAGNOSIS — F17290 Nicotine dependence, other tobacco product, uncomplicated: Secondary | ICD-10-CM | POA: Diagnosis present

## 2023-11-03 DIAGNOSIS — F1910 Other psychoactive substance abuse, uncomplicated: Secondary | ICD-10-CM | POA: Diagnosis present

## 2023-11-03 DIAGNOSIS — F419 Anxiety disorder, unspecified: Secondary | ICD-10-CM | POA: Diagnosis present

## 2023-11-03 DIAGNOSIS — Z955 Presence of coronary angioplasty implant and graft: Secondary | ICD-10-CM

## 2023-11-03 DIAGNOSIS — T50912A Poisoning by multiple unspecified drugs, medicaments and biological substances, intentional self-harm, initial encounter: Secondary | ICD-10-CM | POA: Diagnosis present

## 2023-11-03 DIAGNOSIS — Z87442 Personal history of urinary calculi: Secondary | ICD-10-CM

## 2023-11-03 DIAGNOSIS — G4700 Insomnia, unspecified: Secondary | ICD-10-CM | POA: Diagnosis present

## 2023-11-03 DIAGNOSIS — Z8249 Family history of ischemic heart disease and other diseases of the circulatory system: Secondary | ICD-10-CM

## 2023-11-03 DIAGNOSIS — G40909 Epilepsy, unspecified, not intractable, without status epilepticus: Secondary | ICD-10-CM | POA: Diagnosis present

## 2023-11-03 DIAGNOSIS — Z7902 Long term (current) use of antithrombotics/antiplatelets: Secondary | ICD-10-CM

## 2023-11-03 DIAGNOSIS — Z800 Family history of malignant neoplasm of digestive organs: Secondary | ICD-10-CM

## 2023-11-03 DIAGNOSIS — F1420 Cocaine dependence, uncomplicated: Secondary | ICD-10-CM | POA: Diagnosis present

## 2023-11-03 DIAGNOSIS — Z560 Unemployment, unspecified: Secondary | ICD-10-CM | POA: Diagnosis not present

## 2023-11-03 DIAGNOSIS — Z634 Disappearance and death of family member: Secondary | ICD-10-CM

## 2023-11-03 DIAGNOSIS — Z5986 Financial insecurity: Secondary | ICD-10-CM

## 2023-11-03 DIAGNOSIS — S069XAA Unspecified intracranial injury with loss of consciousness status unknown, initial encounter: Secondary | ICD-10-CM | POA: Diagnosis present

## 2023-11-03 DIAGNOSIS — Z880 Allergy status to penicillin: Secondary | ICD-10-CM

## 2023-11-03 DIAGNOSIS — F1994 Other psychoactive substance use, unspecified with psychoactive substance-induced mood disorder: Secondary | ICD-10-CM | POA: Diagnosis present

## 2023-11-03 DIAGNOSIS — F332 Major depressive disorder, recurrent severe without psychotic features: Principal | ICD-10-CM | POA: Diagnosis present

## 2023-11-03 DIAGNOSIS — Z91410 Personal history of adult physical and sexual abuse: Secondary | ICD-10-CM

## 2023-11-03 DIAGNOSIS — Z9889 Other specified postprocedural states: Secondary | ICD-10-CM

## 2023-11-03 DIAGNOSIS — Z87820 Personal history of traumatic brain injury: Secondary | ICD-10-CM

## 2023-11-03 DIAGNOSIS — Z91411 Personal history of adult psychological abuse: Secondary | ICD-10-CM

## 2023-11-03 DIAGNOSIS — Z888 Allergy status to other drugs, medicaments and biological substances status: Secondary | ICD-10-CM

## 2023-11-03 MED ORDER — TRAZODONE HCL 50 MG PO TABS
50.0000 mg | ORAL_TABLET | ORAL | Status: AC
  Administered 2023-11-03: 50 mg via ORAL
  Filled 2023-11-03 (×2): qty 1

## 2023-11-03 MED ORDER — ALUM & MAG HYDROXIDE-SIMETH 200-200-20 MG/5ML PO SUSP: 30.0000 mL | ORAL | Status: DC | PRN

## 2023-11-03 MED ORDER — LEVOFLOXACIN 750 MG PO TABS
750.0000 mg | ORAL_TABLET | Freq: Every day | ORAL | Status: AC
  Administered 2023-11-03 – 2023-11-06 (×3): 750 mg via ORAL
  Filled 2023-11-03 (×3): qty 1
  Filled 2023-11-03: qty 3
  Filled 2023-11-03: qty 1

## 2023-11-03 MED ORDER — ACETAMINOPHEN 325 MG PO TABS
650.0000 mg | ORAL_TABLET | Freq: Four times a day (QID) | ORAL | Status: DC | PRN
  Administered 2023-11-03 – 2023-11-04 (×2): 650 mg via ORAL
  Filled 2023-11-03 (×2): qty 2

## 2023-11-03 MED ORDER — HALOPERIDOL 5 MG PO TABS: 5.0000 mg | ORAL_TABLET | Freq: Three times a day (TID) | ORAL | Status: DC | PRN

## 2023-11-03 MED ORDER — NICOTINE POLACRILEX 2 MG MT GUM
2.0000 mg | CHEWING_GUM | OROMUCOSAL | Status: DC | PRN
  Administered 2023-11-04: 2 mg via ORAL

## 2023-11-03 MED ORDER — MAGNESIUM HYDROXIDE 400 MG/5ML PO SUSP: 30.0000 mL | Freq: Every day | ORAL | Status: DC | PRN

## 2023-11-03 MED ORDER — DIPHENHYDRAMINE HCL 25 MG PO CAPS: 50.0000 mg | ORAL_CAPSULE | Freq: Three times a day (TID) | ORAL | Status: DC | PRN

## 2023-11-03 NOTE — Plan of Care (Signed)
Nurse discussed anxiety, depression and coping skills with patient.  

## 2023-11-03 NOTE — Tx Team (Signed)
Initial Treatment Plan 11/03/2023 3:44 PM [name] NFA:[id number]    PATIENT STRESSORS: Health problems   Marital or family conflict   Substance abuse     PATIENT STRENGTHS: Supportive family/friends    PATIENT IDENTIFIED PROBLEMS: Substance Abuse  Suicidal ideation  Anxiety  Depression   Ineffective coping skills             DISCHARGE CRITERIA:  Adequate post-discharge living arrangements Safe-care adequate arrangements made  PRELIMINARY DISCHARGE PLAN: Attend aftercare/continuing care group Outpatient therapy Return to previous living arrangement  PATIENT/FAMILY INVOLVEMENT: This treatment plan has been presented to and reviewed with the patient, Ryan Blackburn, and/or family member.  The patient and family have been given the opportunity to ask questions and make suggestions.  Clarene Critchley, RN 11/03/2023, 3:44 PM

## 2023-11-03 NOTE — BHH Group Notes (Signed)
Psychoeducational Group Note  Date:  11/03/2023 Time:  2000  Group Topic/Focus:  Alcoholics Anonymous Meeting  Participation Level: Did Not Attend  Participation Quality:  Not Applicable  Affect:  Not Applicable  Cognitive:  Not Applicable  Insight:  Not Applicable  Engagement in Group: Not Applicable  Additional Comments:  Did not attend.   Johann Capers S 11/03/2023, 9:15 PM
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
## 2023-11-04 DIAGNOSIS — F332 Major depressive disorder, recurrent severe without psychotic features: Principal | ICD-10-CM

## 2023-11-04 MED ORDER — TRAZODONE HCL 50 MG PO TABS
50.0000 mg | ORAL_TABLET | Freq: Every evening | ORAL | Status: DC | PRN
  Administered 2023-11-04 – 2023-11-06 (×3): 50 mg via ORAL
  Filled 2023-11-04 (×3): qty 1

## 2023-11-04 MED ORDER — HYDROXYZINE HCL 25 MG PO TABS
25.0000 mg | ORAL_TABLET | Freq: Three times a day (TID) | ORAL | Status: DC | PRN
  Administered 2023-11-04 (×2): 25 mg via ORAL
  Filled 2023-11-04 (×2): qty 1

## 2023-11-04 MED ORDER — FLUOXETINE HCL 20 MG PO CAPS
20.0000 mg | ORAL_CAPSULE | Freq: Every day | ORAL | Status: DC
  Administered 2023-11-04: 20 mg via ORAL
  Filled 2023-11-04 (×6): qty 1

## 2023-11-04 MED ORDER — CLOPIDOGREL BISULFATE 75 MG PO TABS
75.0000 mg | ORAL_TABLET | Freq: Every day | ORAL | Status: DC
  Administered 2023-11-04 – 2023-11-07 (×3): 75 mg via ORAL
  Filled 2023-11-04 (×7): qty 1

## 2023-11-04 MED ORDER — POTASSIUM CHLORIDE CRYS ER 20 MEQ PO TBCR
40.0000 meq | EXTENDED_RELEASE_TABLET | Freq: Once | ORAL | Status: AC
  Administered 2023-11-04: 40 meq via ORAL
  Filled 2023-11-04: qty 2
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
## 2023-11-04 NOTE — Progress Notes (Signed)
   11/04/23 0558  15 Minute Checks  Location Bedroom  Visual Appearance Calm  Behavior Sleeping  Sleep (Behavioral Health Patients Only)  Calculate sleep? (Click Yes once per 24 hr at 0600 safety check) Yes  Documented sleep last 24 hours 2

## 2023-11-04 NOTE — Group Note (Signed)
Date:  11/04/2023 Time:  4:43 AM  Group Topic/Focus:  Wrap-Up Group:   The focus of this group is to help patients review their daily goal of treatment and discuss progress on daily workbooks.    Participation Level:  Did Not Attend  Participation Quality:   N/A  Affect:   N/A  Cognitive:   N/A\  Insight: None  Engagement in Group:   N/A  Modes of Intervention:   N/A  Additional Comments:  Patient did not attend wrap up group.   Kennieth Francois 11/04/2023, 4:43 AM

## 2023-11-04 NOTE — Plan of Care (Signed)
  Problem: Safety: Goal: Periods of time without injury will increase Outcome: Progressing   Problem: Activity: Goal: Sleeping patterns will improve Outcome: Not Progressing

## 2023-11-04 NOTE — [facility] Group Notes (Signed)
Psychoeducational Group Note  Date:  11/04/2023 Time:  8:30  Group Topic/Focus:  Wrap-Up Group:   The focus of this group is to help patients review their daily goal of treatment and discuss progress on daily workbooks.  Participation Level: Did Not Attend  Participation Quality:  Not Applicable  Affect:  Not Applicable  Cognitive:  Not Applicable  Insight:  Not Applicable  Engagement in Group: Not Applicable  Additional Comments:  Pt didn't attend wrap-up group due being in their room.  Hanako Tipping, Sharen Counter 11/04/2023, 9:52 PM

## 2023-11-04 NOTE — Progress Notes (Signed)
   11/03/23 2030  Psych Admission Type (Psych Patients Only)  Admission Status Involuntary  Psychosocial Assessment  Patient Complaints Substance abuse  Eye Contact Fair  Facial Expression Flat  Affect Depressed  Speech Logical/coherent  Interaction Minimal  Motor Activity Slow  Appearance/Hygiene Disheveled  Behavior Characteristics Cooperative  Mood Depressed  Thought Process  Coherency Circumstantial  Content WDL  Delusions None reported or observed  Perception WDL  Hallucination None reported or observed  Judgment Impaired  Confusion None  Danger to Self  Current suicidal ideation? Denies  Agreement Not to Harm Self Yes  Description of Agreement verbal  Danger to Others  Danger to Others None reported or observed

## 2023-11-04 NOTE — Plan of Care (Signed)
  Problem: Education: Goal: Emotional status will improve Outcome: Not Progressing Goal: Mental status will improve Outcome: Not Progressing   Problem: Activity: Goal: Interest or engagement in activities will improve Outcome: Not Progressing   Patient presents irritable and anxious.  Medications given as prescribed.   Denies suicidal thoughts, auditory and visual hallucinations.  Patient is withdrawn and isolative to his room for majority of this shift.  Refused to engage with provider when prompted multiple times.  Routine safety checks maintained.  Food and fluid intake encouraged.  Patient is safe on the unit.
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
## 2023-11-04 NOTE — Group Note (Signed)
Recreation Therapy Group Note   Group Topic:Animal Assisted Therapy   Group Date: 11/04/2023 Start Time: 0950 End Time: 1030 Facilitators: Refael Fulop-McCall, LRT,CTRS Location: [address] Dayroom   Animal-Assisted Activity (AAA) Program Checklist/Progress Notes Patient Eligibility Criteria Checklist & Daily Group note for Rec Tx Intervention  AAA/T Program Assumption of Risk Form signed by Patient/ or Parent Legal Guardian Yes  Patient understands his/her participation is voluntary Yes  Education: Charity fundraiser, Appropriate Animal Interaction   Education Outcome: Acknowledges education.    Affect/Mood: N/A   Participation Level: Did not attend    Clinical Observations/Individualized Feedback:      Plan: Continue to engage patient in RT group sessions 2-3x/week.   Quamaine Webb-McCall, LRT,CTRS 11/04/2023 12:50 PM

## 2023-11-05 ENCOUNTER — Encounter (HOSPITAL_COMMUNITY): Payer: Self-pay

## 2023-11-05 DIAGNOSIS — F332 Major depressive disorder, recurrent severe without psychotic features: Secondary | ICD-10-CM | POA: Diagnosis not present

## 2023-11-05 MED ORDER — CLONIDINE HCL 0.1 MG PO TABS
0.1000 mg | ORAL_TABLET | ORAL | Status: DC
  Filled 2023-11-05 (×4): qty 1

## 2023-11-05 MED ORDER — MELATONIN 5 MG PO TABS
10.0000 mg | ORAL_TABLET | Freq: Every day | ORAL | Status: DC
  Administered 2023-11-05 – 2023-11-06 (×3): 10 mg via ORAL
  Filled 2023-11-05 (×6): qty 2

## 2023-11-05 MED ORDER — CLONIDINE HCL 0.1 MG PO TABS
0.1000 mg | ORAL_TABLET | Freq: Every day | ORAL | Status: DC
  Filled 2023-11-05: qty 1

## 2023-11-05 MED ORDER — HYDROXYZINE HCL 25 MG PO TABS
25.0000 mg | ORAL_TABLET | Freq: Four times a day (QID) | ORAL | Status: DC | PRN
  Administered 2023-11-06: 25 mg via ORAL
  Filled 2023-11-05: qty 1

## 2023-11-05 MED ORDER — NAPROXEN 500 MG PO TABS
500.0000 mg | ORAL_TABLET | Freq: Two times a day (BID) | ORAL | Status: DC | PRN
Start: 2023-11-05 — End: 2023-11-07
  Administered 2023-11-05 – 2023-11-07 (×3): 500 mg via ORAL
  Filled 2023-11-05 (×3): qty 1

## 2023-11-05 MED ORDER — CLONIDINE HCL 0.1 MG PO TABS
0.1000 mg | ORAL_TABLET | Freq: Four times a day (QID) | ORAL | Status: AC
  Administered 2023-11-05 – 2023-11-06 (×4): 0.1 mg via ORAL
  Filled 2023-11-05 (×8): qty 1

## 2023-11-05 MED ORDER — DICYCLOMINE HCL 20 MG PO TABS
20.0000 mg | ORAL_TABLET | Freq: Four times a day (QID) | ORAL | Status: DC | PRN
Start: 2023-11-05 — End: 2023-11-07

## 2023-11-05 MED ORDER — LOPERAMIDE HCL 2 MG PO CAPS: 2.0000 mg | ORAL_CAPSULE | ORAL | Status: DC | PRN

## 2023-11-05 MED ORDER — METHOCARBAMOL 500 MG PO TABS
500.0000 mg | ORAL_TABLET | Freq: Three times a day (TID) | ORAL | Status: DC | PRN
Start: 2023-11-05 — End: 2023-11-07
  Administered 2023-11-05 – 2023-11-06 (×2): 500 mg via ORAL
  Filled 2023-11-05 (×2): qty 1

## 2023-11-05 MED ORDER — ONDANSETRON 4 MG PO TBDP: 4.0000 mg | ORAL_TABLET | Freq: Four times a day (QID) | ORAL | Status: DC | PRN

## 2023-11-05 NOTE — Progress Notes (Signed)
D) Pt received calm, visible, participating in milieu, and in no acute distress. Pt A & O x4. Pt denies SI, HI, A/ V H, depression and anxiety at this time.  Pt endorses back pain, and body aches A) Pt encouraged to drink fluids. Pt encouraged to come to staff with needs. Pt encouraged to attend and participate in groups. Pt encouraged to set reachable goals. PRN medication provided   R) Pt remained safe on unit, in no acute distress, will continue to assess.     11/05/23 2200  Psych Admission Type (Psych Patients Only)  Admission Status Involuntary  Psychosocial Assessment  Patient Complaints Irritability  Eye Contact Brief  Facial Expression Animated  Affect Irritable;Preoccupied  Speech Logical/coherent  Interaction Forwards little  Motor Activity Slow  Appearance/Hygiene Disheveled  Behavior Characteristics Irritable  Mood Depressed  Thought Process  Coherency Circumstantial  Content WDL  Delusions None reported or observed  Perception WDL  Hallucination None reported or observed  Judgment Impaired  Confusion None  Danger to Self  Current suicidal ideation? Denies  Agreement Not to Harm Self Yes  Description of Agreement verbal  Danger to Others  Danger to Others None reported or observed

## 2023-11-05 NOTE — Progress Notes (Signed)
   11/04/23 2130  Psych Admission Type (Psych Patients Only)  Admission Status Involuntary  Psychosocial Assessment  Patient Complaints None  Eye Contact Fair  Facial Expression Flat  Affect Depressed;Irritable  Speech Logical/coherent  Interaction Minimal  Motor Activity Slow  Appearance/Hygiene Disheveled  Behavior Characteristics Irritable  Mood Depressed  Thought Process  Coherency Circumstantial  Content WDL  Delusions None reported or observed  Perception WDL  Hallucination None reported or observed  Judgment Impaired  Confusion None  Danger to Self  Current suicidal ideation? Denies  Agreement Not to Harm Self Yes  Description of Agreement verbal  Danger to Others  Danger to Others None reported or observed

## 2023-11-05 NOTE — Progress Notes (Signed)
   11/05/23 0557  15 Minute Checks  Location Bedroom  Visual Appearance Calm  Behavior Sleeping  Sleep (Behavioral Health Patients Only)  Calculate sleep? (Click Yes once per 24 hr at 0600 safety check) Yes  Documented sleep last 24 hours 9.5

## 2023-11-05 NOTE — Group Note (Signed)
Recreation Therapy Group Note   Group Topic:Team Building  Group Date: 11/05/2023 Start Time: 0930 End Time: 1000 Facilitators: Ravenne Wayment-McCall, LRT,CTRS Location: [address] Dayroom   Group Topic: Communication, Team Building, Problem Solving  Goal Area(s) Addresses:  Patient will effectively work with peer towards shared goal.  Patient will identify skills used to make activity successful.  Patient will identify how skills used during activity can be used to reach post d/c goals.   Intervention: STEM Activity  Group Description: Stage manager. In teams of 3-5, patients were given 12 plastic drinking straws and an equal length of masking tape. Using the materials provided, patients were asked to build a landing pad to catch a golf ball dropped from approximately 5 feet in the air. All materials were required to be used by the team in their design. LRT facilitated post-activity discussion.  Education: Pharmacist, community, Scientist, physiological, Discharge Planning   Education Outcome: Acknowledges education/In group clarification offered/Needs additional education.    Affect/Mood: N/A   Participation Level: Did not attend    Clinical Observations/Individualized Feedback:     Plan: Continue to engage patient in RT group sessions 2-3x/week.   Myrl Lazarus-McCall, LRT,CTRS 11/05/2023 1:34 PM

## 2023-11-05 NOTE — Plan of Care (Signed)
  Problem: Safety: Goal: Periods of time without injury will increase Outcome: Progressing   Problem: Activity: Goal: Interest or engagement in activities will improve Outcome: Not Progressing Goal: Sleeping patterns will improve Outcome: Not Progressing
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
## 2023-11-05 NOTE — Plan of Care (Signed)
  Problem: Education: Goal: Emotional status will improve Outcome: Progressing Goal: Verbalization of understanding the information provided will improve Outcome: Progressing   Problem: Activity: Goal: Sleeping patterns will improve Outcome: Progressing

## 2023-11-05 NOTE — Group Note (Signed)
Date:  11/05/2023 Time:  10:34 AM  Group Topic/Focus:  Goals Group:   The focus of this group is to help patients establish daily goals to achieve during treatment and discuss how the patient can incorporate goal setting into their daily lives to aide in recovery. Orientation:   The focus of this group is to educate the patient on the purpose and policies of crisis stabilization and provide a format to answer questions about their admission.  The group details unit policies and expectations of patients while admitted.    Participation Level:  Did Not Attend  Participation Quality:   n/a  Affect:   n/a  Cognitive:   n/a  Insight: None  Engagement in Group:   n/a  Modes of Intervention:   n/a  Additional Comments:   Pt did not attend.  Edmund Hilda Conall Vangorder 11/05/2023, 10:34 AM

## 2023-11-05 NOTE — Group Note (Signed)
LCSW Group Therapy Note   Group Date: 11/04/23 Start Time: 1100 End Time: 1200   .[facility] LCSW Group Therapy Note  Date/Time:    Type of Therapy and Topic:  Group Therapy:  Healthy and Unhealthy Supports  Participation Level:  Did Not Attend   Description of Group:  Patients in this group were introduced to the idea of adding a variety of healthy supports to address the various needs in their lives, especially in reference to their plans and focus for the new year.  Patients discussed what additional healthy supports could be helpful in their recovery and wellness after discharge in order to prevent future hospitalizations.   An emphasis was placed on using counselor, doctor, therapy groups, 12-step groups, and problem-specific support groups to expand supports.    Therapeutic Goals:   1)  discuss importance of adding supports to stay well once out of the hospital  2)  compare healthy versus unhealthy supports and identify some examples of each  3)  generate ideas and descriptions of healthy supports that can be added  4)  offer mutual support about how to address unhealthy supports  5)  encourage active participation in and adherence to discharge plan    Summary of Patient Progress:  The patient was invited but did not participate   Therapeutic Modalities:   Motivational Interviewing Brief Solution-Focused Therapy  Steffanie Dunn, LCSWA 11/05/2023  10:00 AM

## 2023-11-05 NOTE — Progress Notes (Signed)
   11/05/23 0900  Psych Admission Type (Psych Patients Only)  Admission Status Involuntary  Psychosocial Assessment  Patient Complaints Irritability  Eye Contact Brief  Facial Expression Flat  Affect Depressed;Irritable  Speech Logical/coherent  Interaction Minimal;Guarded  Motor Activity Slow  Appearance/Hygiene Disheveled  Behavior Characteristics Irritable  Mood Depressed  Thought Process  Coherency Circumstantial  Content WDL  Delusions None reported or observed  Perception WDL  Hallucination None reported or observed  Judgment Impaired  Confusion None  Danger to Self  Current suicidal ideation? Denies  Agreement Not to Harm Self Yes  Description of Agreement verbal  Danger to Others  Danger to Others None reported or observed

## 2023-11-05 NOTE — BH IP Treatment Plan (Signed)
Interdisciplinary Treatment and Diagnostic Plan Update  11/05/2023 Time of Session: 11:00AM Ryan Blackburn MRN: [id number]  Principal Diagnosis: Major depressive disorder, recurrent severe without psychotic features (HCC)  Secondary Diagnoses: Principal Problem:   Major depressive disorder, recurrent severe without psychotic features (HCC) Active Problems:   Polysubstance abuse (HCC)   Psychoactive substance-induced mood disorder (HCC)   Current Medications:  Current Facility-Administered Medications  Medication Dose Route Frequency Provider Last Rate Last Admin   acetaminophen (TYLENOL) tablet 650 mg  650 mg Oral Q6H PRN Chales Abrahams, NP   650 mg at 11/04/23 2319   alum & mag hydroxide-simeth (MAALOX/MYLANTA) 200-200-20 MG/5ML suspension 30 mL  30 mL Oral Q4H PRN Chales Abrahams, NP       clopidogrel (PLAVIX) tablet 75 mg  75 mg Oral Daily Ntuen, Jesusita Oka, FNP   75 mg at 11/04/23 1750   haloperidol (HALDOL) tablet 5 mg  5 mg Oral TID PRN Chales Abrahams, NP       And   diphenhydrAMINE (BENADRYL) capsule 50 mg  50 mg Oral TID PRN Chales Abrahams, NP       FLUoxetine (PROZAC) capsule 20 mg  20 mg Oral Daily Ntuen, Tina C, FNP   20 mg at 11/04/23 1628   hydrOXYzine (ATARAX) tablet 25 mg  25 mg Oral TID PRN Onuoha, Chinwendu V, NP   25 mg at 11/04/23 2320   levofloxacin (LEVAQUIN) tablet 750 mg  750 mg Oral Daily Ophelia Shoulder E, NP   750 mg at 11/04/23 1037   magnesium hydroxide (MILK OF MAGNESIA) suspension 30 mL  30 mL Oral Daily PRN Chales Abrahams, NP       melatonin tablet 10 mg  10 mg Oral QHS Sindy Guadeloupe, NP   10 mg at 11/05/23 0117   nicotine polacrilex (NICORETTE) gum 2 mg  2 mg Oral PRN Massengill, Harrold Donath, MD   2 mg at 11/04/23 1629   traZODone (DESYREL) tablet 50 mg  50 mg Oral QHS PRN Cecilie Lowers, FNP   50 mg at 11/04/23 2320   PTA Medications: No medications prior to admission.    Patient Stressors: Health problems   Marital or family conflict   Substance abuse     Patient Strengths: Supportive family/friends   Treatment Modalities: Medication Management, Group therapy, Case management,  1 to 1 session with clinician, Psychoeducation, Recreational therapy.   Physician Treatment Plan for Primary Diagnosis: Major depressive disorder, recurrent severe without psychotic features (HCC) Long Term Goal(s): Improvement in symptoms so as ready for discharge   Short Term Goals: Ability to identify changes in lifestyle to reduce recurrence of condition will improve Ability to verbalize feelings will improve Ability to disclose and discuss suicidal ideas Ability to demonstrate self-control will improve Ability to identify and develop effective coping behaviors will improve Ability to maintain clinical measurements within normal limits will improve Compliance with prescribed medications will improve Ability to identify triggers associated with substance abuse/mental health issues will improve  Medication Management: Evaluate patient's response, side effects, and tolerance of medication regimen.  Therapeutic Interventions: 1 to 1 sessions, Unit Group sessions and Medication administration.  Evaluation of Outcomes: Not Progressing  Physician Treatment Plan for Secondary Diagnosis: Principal Problem:   Major depressive disorder, recurrent severe without psychotic features (HCC) Active Problems:   Polysubstance abuse (HCC)   Psychoactive substance-induced mood disorder (HCC)  Long Term Goal(s): Improvement in symptoms so as ready for discharge   Short Term Goals: Ability to identify changes  in lifestyle to reduce recurrence of condition will improve Ability to verbalize feelings will improve Ability to disclose and discuss suicidal ideas Ability to demonstrate self-control will improve Ability to identify and develop effective coping behaviors will improve Ability to maintain clinical measurements within normal limits will improve Compliance with  prescribed medications will improve Ability to identify triggers associated with substance abuse/mental health issues will improve     Medication Management: Evaluate patient's response, side effects, and tolerance of medication regimen.  Therapeutic Interventions: 1 to 1 sessions, Unit Group sessions and Medication administration.  Evaluation of Outcomes: Not Progressing   RN Treatment Plan for Primary Diagnosis: Major depressive disorder, recurrent severe without psychotic features (HCC) Long Term Goal(s): Knowledge of disease and therapeutic regimen to maintain health will improve  Short Term Goals: Ability to remain free from injury will improve, Ability to verbalize frustration and anger appropriately will improve, Ability to demonstrate self-control, Ability to participate in decision making will improve, Ability to verbalize feelings will improve, Ability to disclose and discuss suicidal ideas, Ability to identify and develop effective coping behaviors will improve, and Compliance with prescribed medications will improve  Medication Management: RN will administer medications as ordered by provider, will assess and evaluate patient's response and provide education to patient for prescribed medication. RN will report any adverse and/or side effects to prescribing provider.  Therapeutic Interventions: 1 on 1 counseling sessions, Psychoeducation, Medication administration, Evaluate responses to treatment, Monitor vital signs and CBGs as ordered, Perform/monitor CIWA, COWS, AIMS and Fall Risk screenings as ordered, Perform wound care treatments as ordered.  Evaluation of Outcomes: Not Progressing   LCSW Treatment Plan for Primary Diagnosis: Major depressive disorder, recurrent severe without psychotic features (HCC) Long Term Goal(s): Safe transition to appropriate next level of care at discharge, Engage patient in therapeutic group addressing interpersonal concerns.  Short Term Goals:  Engage patient in aftercare planning with referrals and resources, Increase social support, Increase ability to appropriately verbalize feelings, Increase emotional regulation, Facilitate acceptance of mental health diagnosis and concerns, Facilitate patient progression through stages of change regarding substance use diagnoses and concerns, Identify triggers associated with mental health/substance abuse issues, and Increase skills for wellness and recovery  Therapeutic Interventions: Assess for all discharge needs, 1 to 1 time with Social worker, Explore available resources and support systems, Assess for adequacy in community support network, Educate family and significant other(s) on suicide prevention, Complete Psychosocial Assessment, Interpersonal group therapy.  Evaluation of Outcomes: Not Progressing   Progress in Treatment: Attending groups: No. Participating in groups: No. Taking medication as prescribed: No. Toleration medication: No. Family/Significant other contact made: No, will contact:  consents pending Patient understands diagnosis: Yes. Discussing patient identified problems/goals with staff: Yes. Medical problems stabilized or resolved: Yes. Denies suicidal/homicidal ideation: Yes. Issues/concerns per patient self-inventory: No.  New problem(s) identified: No, Describe:  none reported  New Short Term/Long Term Goal(s): detox, medication management for mood stabilization; elimination of SI thoughts; development of comprehensive mental wellness/sobriety plan    Patient Goals:  "I have no goals I do not belong here"  Discharge Plan or Barriers: Patient recently admitted. CSW will continue to follow and assess for appropriate referrals and possible discharge planning.    Reason for Continuation of Hospitalization: Depression Medication stabilization Suicidal ideation Withdrawal symptoms  Estimated Length of Stay: 5-7 days  Last 3 [place] Suicide Severity Risk  Score: Flowsheet Row Ad[facility] (Current) [facility] from 09/12/2023 in [facility] Department  at [facility] ED from 02/13/2022 in [facility] Department at [facility]  C-SSRS RISK CATEGORY No Risk No Risk No Risk       Last [facility] 2/9 Scores:    10/01/2015    4:35 PM  Depression screen PHQ 2/9  Decreased Interest 0  Down, Depressed, Hopeless 1  PHQ - 2 Score 1  Altered sleeping 1  Tired, decreased energy 0  Change in appetite 0  Feeling bad or failure about yourself  1  Trouble concentrating 0  Moving slowly or fidgety/restless 0  Suicidal thoughts 0  PHQ-9 Score 3    Scribe for Treatment Team: Esmeralda Arthur 11/05/2023 12:57 PM

## 2023-11-05 NOTE — [facility] Group Notes (Signed)

## 2023-11-06 DIAGNOSIS — F332 Major depressive disorder, recurrent severe without psychotic features: Secondary | ICD-10-CM | POA: Diagnosis not present

## 2023-11-06 LAB — BASIC METABOLIC PANEL
Anion gap: 6 (ref 5–15)
BUN: 25 mg/dL — ABNORMAL HIGH (ref 6–20)
CO2: 23 mmol/L (ref 22–32)
Calcium: 8.9 mg/dL (ref 8.9–10.3)
Chloride: 107 mmol/L (ref 98–111)
Creatinine, Ser: 0.91 mg/dL (ref 0.61–1.24)
GFR, Estimated: >60 mL/min (ref >60)
Glucose, Bld: 89 mg/dL (ref 70–99)
Potassium: 3.9 mmol/L (ref 3.5–5.1)
Sodium: 136 mmol/L (ref 135–145)
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
## 2023-11-06 NOTE — Group Note (Signed)
LCSW Group Therapy Note  Group Date: 11/06/2023 Start Time: 1100 End Time: 1200   Type of Therapy and Topic:  Group Therapy - Healthy vs Unhealthy Coping Skills  Participation Level:  Did Not Attend   Description of Group The focus of this group was to determine what unhealthy coping techniques typically are used by group members and what healthy coping techniques would be helpful in coping with various problems. Patients were guided in becoming aware of the differences between healthy and unhealthy coping techniques. Patients were asked to identify 2-3 healthy coping skills they would like to learn to use more effectively.  Therapeutic Goals Patients learned that coping is what human beings do all day long to deal with various situations in their lives Patients defined and discussed healthy vs unhealthy coping techniques Patients identified their preferred coping techniques and identified whether these were healthy or unhealthy Patients determined 2-3 healthy coping skills they would like to become more familiar with and use more often. Patients provided support and ideas to each other   Summary of Patient Progress:  Did not attend   Therapeutic Modalities Cognitive Behavioral Therapy Motivational Interviewing  Kathi Der, LCSWA 11/06/2023  3:07 PM

## 2023-11-06 NOTE — Group Note (Signed)
Date:  11/06/2023 Time:  5:19 AM  Group Topic/Focus:  Wrap-Up Group:   The focus of this group is to help patients review their daily goal of treatment and discuss progress on daily workbooks.    Participation Level:  Did Not Attend  Participation Quality:   n/a  Affect:   n/a  Cognitive:   n/a  Insight: None  Engagement in Group:   n/a  Modes of Intervention:   n/a  Additional Comments:  Patient did not attend wrap up group.   Kennieth Francois 11/06/2023, 5:19 AM

## 2023-11-06 NOTE — Progress Notes (Signed)
   11/06/23 0900  Psych Admission Type (Psych Patients Only)  Admission Status Involuntary  Psychosocial Assessment  Patient Complaints Irritability  Eye Contact Brief  Facial Expression Animated;Angry  Affect Irritable  Speech Logical/coherent  Interaction Minimal  Motor Activity Slow  Appearance/Hygiene Disheveled  Behavior Characteristics Irritable  Mood Angry;Preoccupied  Thought Process  Coherency Circumstantial  Content WDL  Delusions None reported or observed  Perception WDL  Hallucination None reported or observed  Judgment Impaired  Confusion None  Danger to Self  Current suicidal ideation? Denies  Agreement Not to Harm Self Yes  Description of Agreement verbal  Danger to Others  Danger to Others None reported or observed

## 2023-11-06 NOTE — [facility] Group Notes (Signed)
[facility] Group Notes:  (Nursing/MHT/Case Management/Adjunct)   Modes of Intervention:  Adult Psychoeducational Group Note  Date:  11/06/2023 Time:  9:22 AM  Group Topic/Focus:  Goals Group:   The focus of this group is to help patients establish daily goals to achieve during treatment and discuss how the patient can incorporate goal setting into their daily lives to aide in recovery. Orientation:   The focus of this group is to educate the patient on the purpose and policies of crisis stabilization and provide a format to answer questions about their admission.  The group details unit policies and expectations of patients while admitted.  Participation Level:  Did Not Attend    Additional Comments:  Did not attend  Ryan Blackburn 11/06/2023, 9:22 AM  Summary of Progress/Problems:  Ryan Blackburn 11/06/2023, 9:21 AM

## 2023-11-06 NOTE — Progress Notes (Signed)
Patient informed of room lockout order for today. Patient states "I hate you too" and takes blanket out to lay down in the hallway. Patient refuses to get friend's phone number out of phone at this time.

## 2023-11-06 NOTE — Plan of Care (Signed)
  Problem: Education: Goal: Emotional status will improve Outcome: Progressing Goal: Verbalization of understanding the information provided will improve Outcome: Progressing   Problem: Activity: Goal: Sleeping patterns will improve Outcome: Progressing   Problem: Coping: Goal: Ability to demonstrate self-control will improve Outcome: Progressing

## 2023-11-07 MED ORDER — HYDROXYZINE HCL 25 MG PO TABS: 25.0000 mg | ORAL_TABLET | Freq: Four times a day (QID) | ORAL | 0 refills | Status: AC | PRN

## 2023-11-07 MED ORDER — MELATONIN 10 MG PO TABS: 10.0000 mg | ORAL_TABLET | Freq: Every day | ORAL | 0 refills | Status: DC

## 2023-11-07 MED ORDER — MELATONIN 10 MG PO TABS: 10.0000 mg | ORAL_TABLET | Freq: Every day | ORAL | 0 refills | Status: AC

## 2023-11-07 MED ORDER — FLUOXETINE HCL 20 MG PO CAPS: 20.0000 mg | ORAL_CAPSULE | Freq: Every day | ORAL | 0 refills | Status: AC

## 2023-11-07 MED ORDER — HYDROXYZINE HCL 25 MG PO TABS: 25.0000 mg | ORAL_TABLET | Freq: Four times a day (QID) | ORAL | 0 refills | Status: DC | PRN

## 2023-11-07 MED ORDER — TRAZODONE HCL 50 MG PO TABS: 50.0000 mg | ORAL_TABLET | Freq: Every evening | ORAL | 0 refills | Status: AC | PRN

## 2023-11-07 MED ORDER — TRAZODONE HCL 50 MG PO TABS: 50.0000 mg | ORAL_TABLET | Freq: Every evening | ORAL | 0 refills | Status: DC | PRN

## 2023-11-07 MED ORDER — FLUOXETINE HCL 20 MG PO CAPS: 20.0000 mg | ORAL_CAPSULE | Freq: Every day | ORAL | 0 refills | Status: DC

## 2023-11-07 MED ORDER — CLOPIDOGREL BISULFATE 75 MG PO TABS: 75.0000 mg | ORAL_TABLET | Freq: Every day | ORAL | 0 refills | Status: AC

## 2023-11-07 MED ORDER — CLOPIDOGREL BISULFATE 75 MG PO TABS: 75.0000 mg | ORAL_TABLET | Freq: Every day | ORAL | 0 refills | Status: DC

## 2023-11-07 MED ORDER — NICOTINE POLACRILEX 2 MG MT GUM: 2.0000 mg | CHEWING_GUM | OROMUCOSAL | Status: AC | PRN

## 2023-11-07 NOTE — Plan of Care (Signed)
  Problem: Education: Goal: Emotional status will improve Outcome: Not Progressing   Problem: Activity: Goal: Interest or engagement in activities will improve Outcome: Not Progressing

## 2023-11-07 NOTE — [facility] Group Notes (Signed)
Adult Psychoeducational Group Note  Date:  11/07/2023 Time:  10:20 AM  Group Topic/Focus:  Goals Group:   The focus of this group is to help patients establish daily goals to achieve during treatment and discuss how the patient can incorporate goal setting into their daily lives to aide in recovery.  Participation Level:  Did Not Attend  Participation Quality:  na   Affect:  na  Cognitive:  na  Insight: na  Engagement in Group:  na  Modes of Intervention:  na  Additional Comments:  Pt did not attend group  Macio Kissoon 11/07/2023, 10:20 AM

## 2023-11-07 NOTE — Progress Notes (Signed)
  [facility] Adult Case Management Discharge Plan :  Will you be returning to the same living situation after discharge:  No.  reported that he will be return to [address] [place], [place] At discharge, do you have transportation home?: No. CSW will coordinate transportation Do you have the ability to pay for your medications: Yes,  Pt has [place] [employer] MEDICARE / Suan Halter DUAL COMPLETE  Release of information consent forms completed and in the chart;  Patient's signature needed at discharge.  Patient to Follow up at:  Follow-up Information     [facility] Go on 12/04/2023.   Why: You have an appointment for medication management services on 12/03/22 at 10:00 am. The appointment will be held in person, but you may call to switch to Virtual.  The provider will be send intake forms to be submitted prior to the appointment. Contact information: [address] [place] [address] [phone]         [facility]. Schedule an appointment as soon as possible for a visit.   Why: Please call this provider to personally schedule an appointment for therapy services. Contact information: [address] [place], [place] [address] [phone]         [facility] Follow up.   Why: You may also call this provider for therapy and/or medication management services. Contact information: [address]  [address] [place] [address] [phone]                 Next level of care provider has access to [facility] Link:no  Safety Planning and Suicide Prevention discussed: Yes   Has patient been referred to the Quitline?: Patient refused referral for treatment  Patient has been referred for addiction treatment: Patient refused referral for treatment.  Steffanie Dunn, LCSWA 11/07/2023, 9:32 AM

## 2023-11-07 NOTE — Progress Notes (Signed)
AVS documentation was reviewed with patient and education provided for discharge. Patient belongings in locker were returned and patient signed belongings sheet. Patient provided with paper scripts to obtain medications at discharge. Patient discharged to lobby to meet taxi to return home to [place], [place].

## 2023-11-07 NOTE — Progress Notes (Signed)
   11/07/23 0545  15 Minute Checks  Location Bedroom  Visual Appearance Calm  Behavior Sleeping  Sleep (Behavioral Health Patients Only)  Calculate sleep? (Click Yes once per 24 hr at 0600 safety check) Yes  Documented sleep last 24 hours 10
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
## 2023-11-07 NOTE — Progress Notes (Signed)
   11/07/23 0900  Psych Admission Type (Psych Patients Only)  Admission Status Involuntary  Psychosocial Assessment  Patient Complaints Irritability  Eye Contact Brief  Facial Expression Animated;Angry  Affect Irritable  Speech Logical/coherent  Interaction Avoidant;Isolative;Minimal  Motor Activity Slow  Appearance/Hygiene Disheveled  Behavior Characteristics Irritable;Cooperative  Mood Irritable  Thought Process  Coherency Circumstantial  Content WDL  Delusions None reported or observed  Perception WDL  Hallucination None reported or observed  Judgment Impaired  Confusion None  Danger to Self  Current suicidal ideation? Denies  Agreement Not to Harm Self Yes  Description of Agreement verbal  Danger to Others  Danger to Others None reported or observed   Patient alert and oriented. Patient denies SI, HI, AVH. Patient endorses pain 7/10 located on the left side of his groin. PRN naproxen administered, per provider orders. Patient COWS score of 6. Scheduled fluoxetine, and clonidine refused by patient. Support and encouragement provided. Routine safety checks conducted every 15 minutes. Patient verbally contracts for safety and remains safe on the unit.

## 2023-11-07 NOTE — Progress Notes (Signed)
   11/06/23 2214  Psych Admission Type (Psych Patients Only)  Admission Status Involuntary  Psychosocial Assessment  Patient Complaints Irritability  Eye Contact Brief  Facial Expression Animated;Angry  Affect Irritable  Speech Logical/coherent  Interaction Avoidant;Isolative  Motor Activity Slow  Appearance/Hygiene Disheveled  Behavior Characteristics Unwilling to participate;Irritable  Mood Angry;Preoccupied  Thought Process  Coherency Circumstantial  Content WDL  Delusions None reported or observed  Perception WDL  Hallucination None reported or observed  Judgment Impaired  Confusion None  Danger to Self  Current suicidal ideation? Denies  Danger to Others  Danger to Others None reported or observed    On assessment, the patient presented moody, anxious, and angry. Patient complained of moderate muscle pain all over. Patient further complained that this was the worst place ever.  Patient also stated he can't handle the noise, the lights, or being around others which is why he keeps to himself.  Patient refused to come up for his medications and to hydrate initially, but eventually came up.  Patient denies SI/HI/AVH.  Administered PRN Hydroxyzine, Robaxin, Naprosyn, and Trazodone for sleep per [facility] per patient request.  Patient is safe on the unit with q15 minute safety checks.

## 2023-11-07 NOTE — [facility] Suicide Risk Assessment (Signed)
[facility] INPATIENT:  Family/Significant Other Suicide Prevention Education  Suicide Prevention Education was reviewed thoroughly with patient, including risk factors, warning signs, and what to do.  Mobile Crisis services were described and that telephone number pointed out, with encouragement to patient to put this number in personal cell phone.  Brochure was provided to patient to share with natural supports.  Patient acknowledged the ways in which they are at risk, and how working through each of their issues can gradually start to reduce their risk factors.  Patient was encouraged to think of the information in the context of people in their own lives.  Patient denied having access to firearms  Patient verbalized understanding of information provided.  Patient endorsed a desire to live.   Steffanie Dunn LCSWA 11/07/2023, 9:43 AM

## 2023-11-07 NOTE — [facility] Suicide Risk Assessment (Signed)
Suicide Risk Assessment  Discharge Assessment    [facility] Discharge Suicide Risk Assessment   Principal Problem: Major depressive disorder, recurrent severe without psychotic features (HCC)  Discharge Diagnoses: Principal Problem:   Major depressive disorder, recurrent severe without psychotic features (HCC) Active Problems:   Seizure disorder (HCC)   TBI (traumatic brain injury) (HCC)   Opioid dependence with opioid-induced mood disorder (HCC)   Psychoactive substance-induced mood disorder (HCC)  Total Time spent with patient:  Greater than 30 minutes  Musculoskeletal: Strength & Muscle Tone: within normal limits Gait & Station: normal Patient leans: N/A  Psychiatric Specialty Exam  Presentation  General Appearance:  Disheveled  Eye Contact: Good  Speech: Clear and Coherent; Normal Rate  Speech Volume: Normal  Handedness: Right   Mood and Affect  Mood: -- (Denies any depressive symptoms, but anxious about getting discharged.)  Duration of Depression Symptoms: No data recorded Affect: Congruent   Thought Process  Thought Processes: Coherent; Linear  Descriptions of Associations:Intact  Orientation:Full (Time, Place and Person)  Thought Content:Logical  History of Schizophrenia/Schizoaffective disorder:No data recorded Duration of Psychotic Symptoms:No data recorded Hallucinations:Hallucinations: None  Ideas of Reference:None  Suicidal Thoughts:Suicidal Thoughts: No  Homicidal Thoughts:Homicidal Thoughts: No   Sensorium  Memory: Immediate Good; Recent Fair; Remote Fair  Judgment: Fair  Insight: Poor   Executive Functions  Concentration: Fair  Attention Span: Fair  Recall: Fair  Fund of Knowledge: Fair  Language: Good   Psychomotor Activity  Psychomotor Activity: Psychomotor Activity: Normal   Assets  Assets: Communication Skills; Desire for Improvement; Housing; Social Support   Sleep  Sleep: Sleep: Good Number  of Hours of Sleep: 7   Physical Exam: See H&P.  Blood pressure 110/65, pulse 61, temperature 97.8 F (36.6 C), temperature source Oral, resp. rate 18, height 5\' 9"  (1.753 m), weight 69.9 kg, SpO2 100%. Body mass index is 22.74 kg/m.  Mental Status Per Nursing Assessment::   On Admission:  Self-harm thoughts  Demographic Factors:  Male, Adolescent or young adult, Caucasian, Low socioeconomic status, and Unemployed  Loss Factors: Decrease in vocational status, Loss of significant relationship, Decline in physical health, and Financial problems/change in socioeconomic status  Historical Factors: Prior suicide attempts and Impulsivity  Risk Reduction Factors:   Sense of responsibility to family, Living with another person, especially a relative, Positive social support, Positive therapeutic relationship, and Positive coping skills or problem solving skills  Continued Clinical Symptoms:  Depression:   Impulsivity Alcohol/Substance Abuse/Dependencies More than one psychiatric diagnosis Previous Psychiatric Diagnoses and Treatments  Cognitive Features That Contribute To Risk:  Closed-mindedness, Polarized thinking, and Thought constriction (tunnel vision)    Suicide Risk:  Minimal: No identifiable suicidal ideation.  Patients presenting with no risk factors but with morbid ruminations; may be classified as minimal risk based on the severity of the depressive symptoms   Follow-up Information     [facility] Go on 12/04/2023.   Why: You have an appointment for medication management services on 12/03/22 at 10:00 am. The appointment will be held in person, but you may call to switch to Virtual.  The provider will be send intake forms to be submitted prior to the appointment. Contact information: [address] [place] [address] [phone]         [facility]. Schedule an appointment as soon as possible for a visit.   Why: Please  call this provider to personally schedule an appointment for therapy services. Contact information: [address]  [address] [place], [place] [address] [phone]         [facility] Follow up.   Why: You may also call this provider for therapy and/or medication management services. Contact information: [address]  [address] [place] [address] [phone]                Plan Of Care/Follow-up recommendations:  See the discharge recommendations above.  Armandina Stammer, NP, pmhnp, fnp-bc. 11/07/2023, 10:47 AM

## 2023-11-07 NOTE — Care Management Important Message (Signed)
 Medicare IM printed and given to Derrell Lolling, LCSW to give to the patient.

## 2023-11-07 NOTE — [facility] Suicide Risk Assessment (Signed)
[facility] INPATIENT:  Family/Significant Other Suicide Prevention Education  Suicide Prevention Education:  Contact Attempts: Trig Parady (mom) [phone], (name of family member/significant other) has been identified by the patient as the family member/significant other with whom the patient will be residing, and identified as the person(s) who will aid the patient in the event of a mental health crisis.  With written consent from the patient, two attempts were made to provide suicide prevention education, prior to and/or following the patient's discharge.  We were unsuccessful in providing suicide prevention education.  A suicide education pamphlet was given to the patient to share with family/significant other.  Date and time of first attempt:11/07/23/042  CSW attempted to complete safety planning, the contact number provided is no longer I service.    Steffanie Dunn 11/07/2023, 9:41 AM

## 2023-11-07 NOTE — Group Note (Signed)
Recreation Therapy Group Note   Group Topic:Stress Management  Group Date: 11/07/2023 Start Time: 0947 End Time: 1009 Facilitators: Nica Friske-McCall, LRT,CTRS Location: [address] Dayroom   Group Topic: Stress Management   Goal Area(s) Addresses:  Patient will actively participate in stress management techniques presented during session.  Patient will successfully identify benefit of practicing stress management post d/c.   Behavioral Response: Appropriate  Intervention: Relaxation exercise with ambient sound and script   Group Description: Guided Imagery. LRT provided education, instruction, and demonstration on practice of visualization via guided imagery. Patient was asked to participate in the technique introduced during session. LRT debriefed including topics of mindfulness, stress management and specific scenarios each patient could use these techniques. Patients were given suggestions of ways to access scripts post d/c and encouraged to explore Youtube and other apps available on smartphones, tablets, and computers.  Education: Stress Management, Discharge Planning.   Education Outcome: Acknowledges education   Affect/Mood: N/A   Participation Level: Did not attend    Clinical Observations/Individualized Feedback:      Plan: Continue to engage patient in RT group sessions 2-3x/week.   Destinie Thornsberry-McCall, LRT,CTRS 11/07/2023 12:10 PM

## 2023-11-13 IMAGING — CR DG CHEST 2V
2 series · 2 of 2 positions shown · non-contrast
Comparison: 05/06/2020, chest CT 04/30/2020

CLINICAL DATA: Chest pain.

EXAM:
CHEST - 2 VIEW

[chest pa]
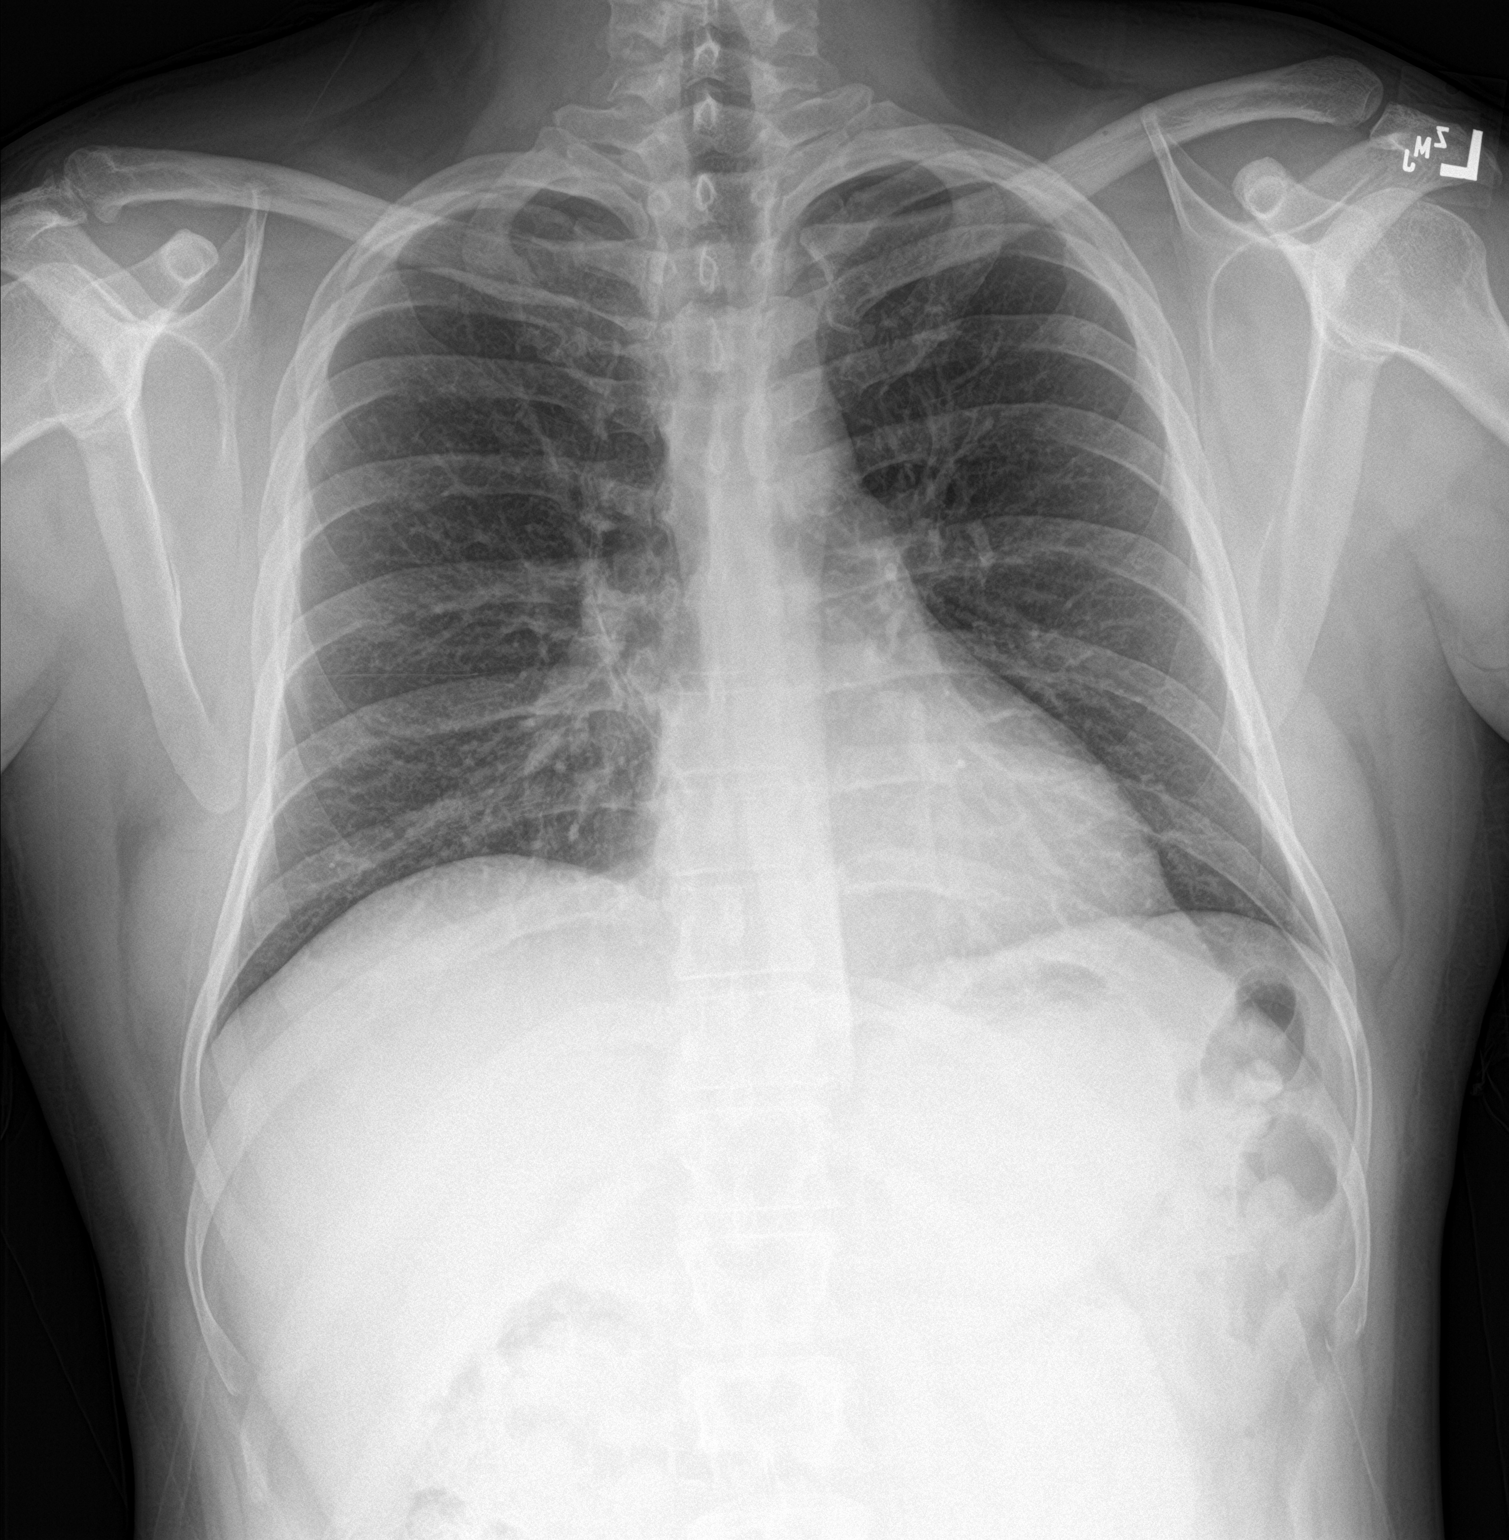

[chest lat]
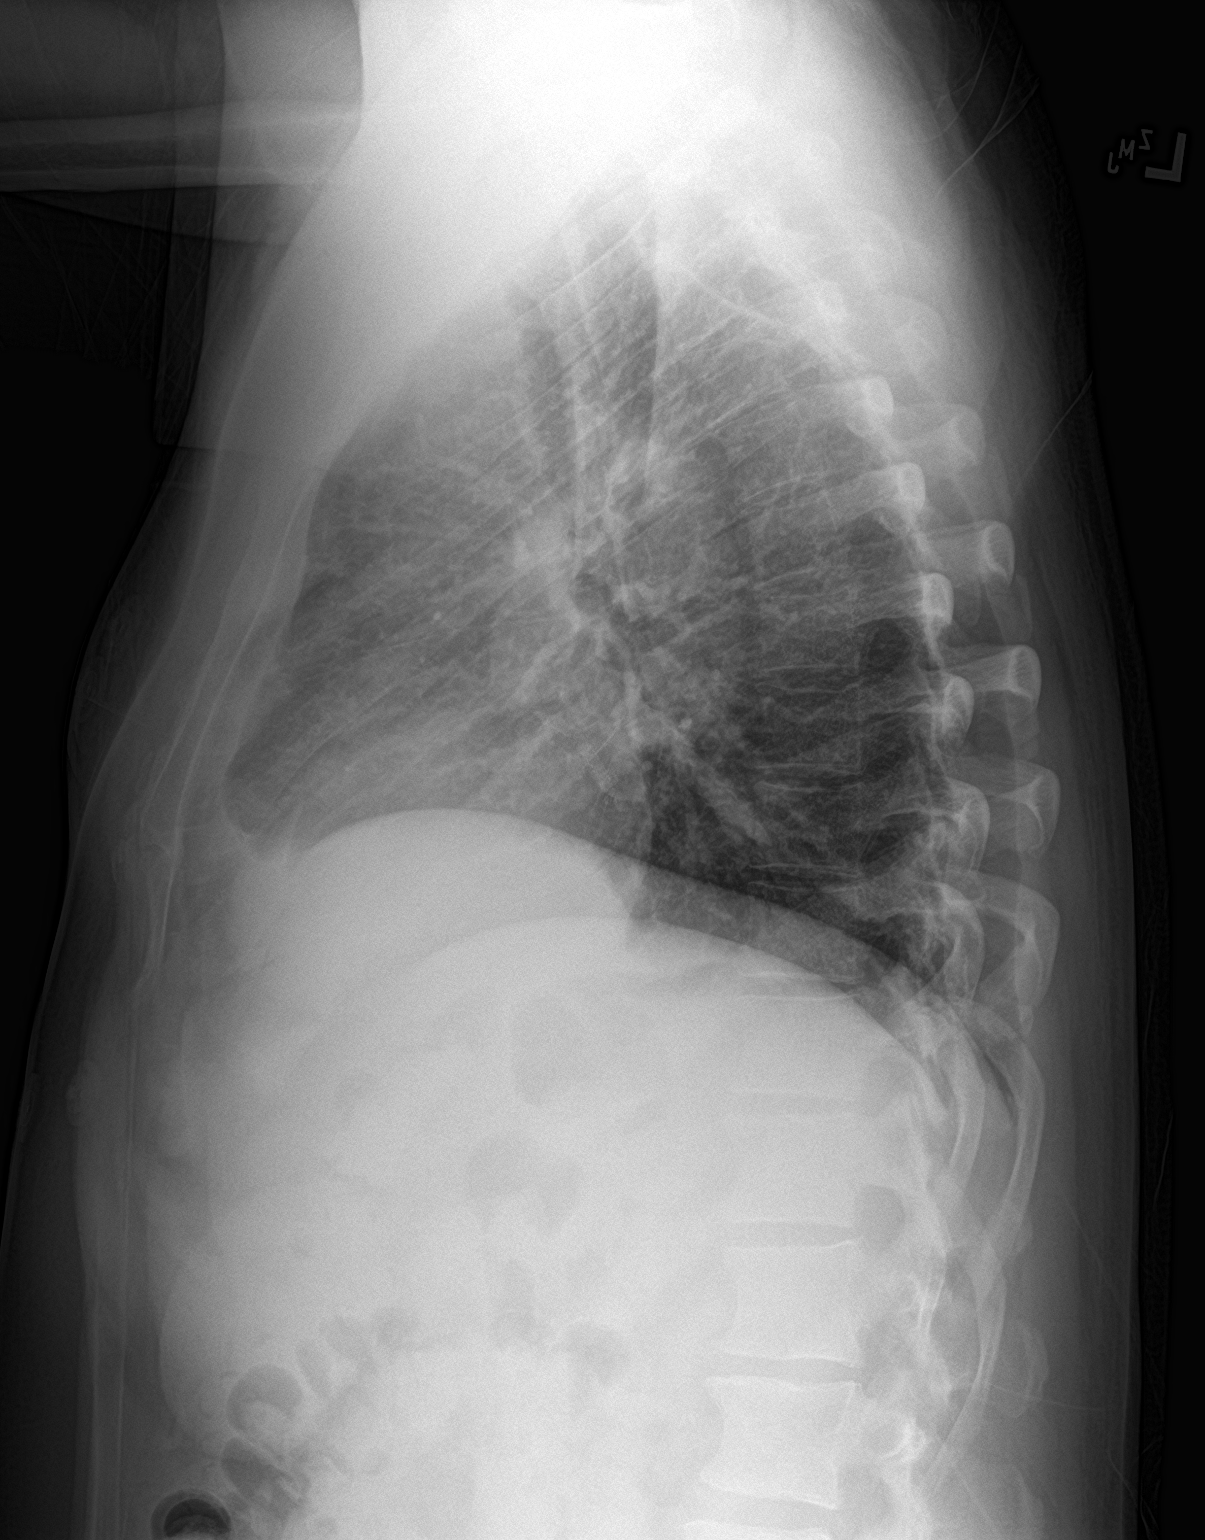

[2 of 2 positions shown; findings below may reference images not displayed]

FINDINGS: The cardiomediastinal contours are normal. The lungs are clear.
Pulmonary vasculature is normal. No consolidation, pleural effusion,
or pneumothorax. No acute osseous abnormalities are seen.
IMPRESSION: Negative radiographs of the chest.

## 2023-11-26 ENCOUNTER — Emergency Department (HOSPITAL_COMMUNITY): Payer: 59

## 2023-11-26 ENCOUNTER — Encounter (HOSPITAL_COMMUNITY): Payer: Self-pay

## 2023-11-26 ENCOUNTER — Other Ambulatory Visit: Payer: Self-pay

## 2023-11-26 ENCOUNTER — Inpatient Hospital Stay (HOSPITAL_COMMUNITY): Payer: 59

## 2023-11-26 ENCOUNTER — Inpatient Hospital Stay (HOSPITAL_COMMUNITY)
Admission: EM | Admit: 2023-11-26 | Discharge: 2023-11-30 | DRG: 380 | Disposition: A | Discharge status: Home / Self Care | Payer: 59 | Attending: Family Medicine | Admitting: Family Medicine

## 2023-11-26 DIAGNOSIS — F32A Depression, unspecified: Secondary | ICD-10-CM | POA: Diagnosis present

## 2023-11-26 DIAGNOSIS — Z9151 Personal history of suicidal behavior: Secondary | ICD-10-CM

## 2023-11-26 DIAGNOSIS — F39 Unspecified mood [affective] disorder: Secondary | ICD-10-CM | POA: Diagnosis present

## 2023-11-26 DIAGNOSIS — R1031 Right lower quadrant pain: Secondary | ICD-10-CM

## 2023-11-26 DIAGNOSIS — Z888 Allergy status to other drugs, medicaments and biological substances status: Secondary | ICD-10-CM

## 2023-11-26 DIAGNOSIS — F1910 Other psychoactive substance abuse, uncomplicated: Secondary | ICD-10-CM | POA: Diagnosis present

## 2023-11-26 DIAGNOSIS — I2699 Other pulmonary embolism without acute cor pulmonale: Secondary | ICD-10-CM | POA: Diagnosis present

## 2023-11-26 DIAGNOSIS — K289 Gastrojejunal ulcer, unspecified as acute or chronic, without hemorrhage or perforation: Secondary | ICD-10-CM | POA: Diagnosis not present

## 2023-11-26 DIAGNOSIS — F17210 Nicotine dependence, cigarettes, uncomplicated: Secondary | ICD-10-CM | POA: Diagnosis present

## 2023-11-26 DIAGNOSIS — F419 Anxiety disorder, unspecified: Secondary | ICD-10-CM | POA: Diagnosis present

## 2023-11-26 DIAGNOSIS — K2211 Ulcer of esophagus with bleeding: Principal | ICD-10-CM | POA: Diagnosis present

## 2023-11-26 DIAGNOSIS — D62 Acute posthemorrhagic anemia: Secondary | ICD-10-CM | POA: Diagnosis present

## 2023-11-26 DIAGNOSIS — K209 Esophagitis, unspecified without bleeding: Secondary | ICD-10-CM | POA: Diagnosis not present

## 2023-11-26 DIAGNOSIS — G40909 Epilepsy, unspecified, not intractable, without status epilepticus: Secondary | ICD-10-CM | POA: Diagnosis present

## 2023-11-26 DIAGNOSIS — K922 Gastrointestinal hemorrhage, unspecified: Secondary | ICD-10-CM | POA: Diagnosis present

## 2023-11-26 DIAGNOSIS — Z86718 Personal history of other venous thrombosis and embolism: Secondary | ICD-10-CM

## 2023-11-26 DIAGNOSIS — Z9861 Coronary angioplasty status: Secondary | ICD-10-CM | POA: Diagnosis not present

## 2023-11-26 DIAGNOSIS — Z8249 Family history of ischemic heart disease and other diseases of the circulatory system: Secondary | ICD-10-CM | POA: Diagnosis not present

## 2023-11-26 DIAGNOSIS — F418 Other specified anxiety disorders: Secondary | ICD-10-CM | POA: Diagnosis not present

## 2023-11-26 DIAGNOSIS — R1032 Left lower quadrant pain: Secondary | ICD-10-CM | POA: Diagnosis not present

## 2023-11-26 DIAGNOSIS — K920 Hematemesis: Secondary | ICD-10-CM | POA: Diagnosis not present

## 2023-11-26 DIAGNOSIS — Z79899 Other long term (current) drug therapy: Secondary | ICD-10-CM

## 2023-11-26 DIAGNOSIS — Z7901 Long term (current) use of anticoagulants: Secondary | ICD-10-CM

## 2023-11-26 DIAGNOSIS — Z87820 Personal history of traumatic brain injury: Secondary | ICD-10-CM | POA: Diagnosis not present

## 2023-11-26 DIAGNOSIS — Z83438 Family history of other disorder of lipoprotein metabolism and other lipidemia: Secondary | ICD-10-CM

## 2023-11-26 DIAGNOSIS — Z955 Presence of coronary angioplasty implant and graft: Secondary | ICD-10-CM

## 2023-11-26 DIAGNOSIS — Z880 Allergy status to penicillin: Secondary | ICD-10-CM | POA: Diagnosis not present

## 2023-11-26 DIAGNOSIS — Z7902 Long term (current) use of antithrombotics/antiplatelets: Secondary | ICD-10-CM | POA: Diagnosis not present

## 2023-11-26 DIAGNOSIS — K2210 Ulcer of esophagus without bleeding: Secondary | ICD-10-CM

## 2023-11-26 DIAGNOSIS — I2510 Atherosclerotic heart disease of native coronary artery without angina pectoris: Secondary | ICD-10-CM | POA: Diagnosis present

## 2023-11-26 HISTORY — DX: Atherosclerotic heart disease of native coronary artery without angina pectoris: I25.10

## 2023-11-26 LAB — URINALYSIS, ROUTINE W REFLEX MICROSCOPIC
Bilirubin Urine: NEGATIVE
Glucose, UA: NEGATIVE mg/dL
Hgb urine dipstick: NEGATIVE
Ketones, ur: 5 mg/dL — AB
Leukocytes,Ua: NEGATIVE
Nitrite: NEGATIVE
Protein, ur: NEGATIVE mg/dL
Specific Gravity, Urine: 1.017 (ref 1.005–1.030)
pH: 9 — ABNORMAL HIGH (ref 5.0–8.0)

## 2023-11-26 LAB — CBC
HCT: 36.5 % — ABNORMAL LOW (ref 39.0–52.0)
Hemoglobin: 12 g/dL — ABNORMAL LOW (ref 13.0–17.0)
MCH: 29.5 pg (ref 26.0–34.0)
MCHC: 32.9 g/dL (ref 30.0–36.0)
MCV: 89.7 fL (ref 80.0–100.0)
Platelets: 282 10*3/uL (ref 150–400)
RBC: 4.07 MIL/uL — ABNORMAL LOW (ref 4.22–5.81)
RDW: 13.7 % (ref 11.5–15.5)
WBC: 8.9 10*3/uL (ref 4.0–10.5)
nRBC: 0 % (ref 0.0–0.2)

## 2023-11-26 LAB — RAPID URINE DRUG SCREEN, HOSP PERFORMED
Amphetamines: POSITIVE — AB
Barbiturates: NOT DETECTED
Benzodiazepines: NOT DETECTED
Cocaine: NOT DETECTED
Opiates: NOT DETECTED
Tetrahydrocannabinol: NOT DETECTED

## 2023-11-26 LAB — COMPREHENSIVE METABOLIC PANEL
ALT: 16 U/L (ref 0–44)
AST: 17 U/L (ref 15–41)
Albumin: 3.4 g/dL — ABNORMAL LOW (ref 3.5–5.0)
Alkaline Phosphatase: 60 U/L (ref 38–126)
Anion gap: 10 (ref 5–15)
BUN: 35 mg/dL — ABNORMAL HIGH (ref 6–20)
CO2: 26 mmol/L (ref 22–32)
Calcium: 9.3 mg/dL (ref 8.9–10.3)
Chloride: 103 mmol/L (ref 98–111)
Creatinine, Ser: 0.95 mg/dL (ref 0.61–1.24)
GFR, Estimated: >60 mL/min (ref >60)
Glucose, Bld: 91 mg/dL (ref 70–99)
Potassium: 4.1 mmol/L (ref 3.5–5.1)
Sodium: 139 mmol/L (ref 135–145)
Total Bilirubin: 0.7 mg/dL (ref 0.0–1.2)
Total Protein: 6.6 g/dL (ref 6.5–8.1)

## 2023-11-26 LAB — TROPONIN I (HIGH SENSITIVITY)
Troponin I (High Sensitivity): 5 ng/L (ref <18)
Troponin I (High Sensitivity): 7 ng/L (ref <18)

## 2023-11-26 LAB — LIPASE, BLOOD: Lipase: 29 U/L (ref 11–51)

## 2023-11-26 LAB — ABO/RH: ABO/RH(D): A POS

## 2023-11-26 MED ORDER — PANTOPRAZOLE SODIUM 40 MG IV SOLR
80.0000 mg | Freq: Once | INTRAVENOUS | Status: AC
  Administered 2023-11-26: 80 mg via INTRAVENOUS
  Filled 2023-11-26: qty 20

## 2023-11-26 MED ORDER — SODIUM CHLORIDE 0.9 % IV BOLUS
1000.0000 mL | Freq: Once | INTRAVENOUS | Status: AC
Start: 2023-11-26 — End: 2023-11-26
  Administered 2023-11-26: 1000 mL via INTRAVENOUS

## 2023-11-26 MED ORDER — ONDANSETRON HCL 4 MG/2ML IJ SOLN
4.0000 mg | Freq: Four times a day (QID) | INTRAMUSCULAR | Status: DC | PRN
  Administered 2023-11-27 – 2023-11-28 (×2): 4 mg via INTRAVENOUS
  Filled 2023-11-26 (×2): qty 2

## 2023-11-26 MED ORDER — ACETAMINOPHEN 325 MG PO TABS
650.0000 mg | ORAL_TABLET | Freq: Four times a day (QID) | ORAL | Status: DC | PRN
  Administered 2023-11-28: 650 mg via ORAL
  Filled 2023-11-26: qty 2

## 2023-11-26 MED ORDER — SODIUM CHLORIDE 0.9% FLUSH
3.0000 mL | Freq: Two times a day (BID) | INTRAVENOUS | Status: DC
  Administered 2023-11-27 – 2023-11-30 (×7): 3 mL via INTRAVENOUS

## 2023-11-26 MED ORDER — PANTOPRAZOLE SODIUM 40 MG IV SOLR
40.0000 mg | Freq: Two times a day (BID) | INTRAVENOUS | Status: DC
  Administered 2023-11-27 – 2023-11-30 (×7): 40 mg via INTRAVENOUS
  Filled 2023-11-26 (×7): qty 10

## 2023-11-26 MED ORDER — OXYCODONE HCL 5 MG PO TABS
5.0000 mg | ORAL_TABLET | ORAL | Status: DC | PRN
  Administered 2023-11-29 – 2023-11-30 (×5): 5 mg via ORAL
  Filled 2023-11-26 (×5): qty 1

## 2023-11-26 MED ORDER — SODIUM CHLORIDE 0.9 % IV SOLN: INTRAVENOUS | Status: AC

## 2023-11-26 MED ORDER — ONDANSETRON 4 MG PO TBDP
4.0000 mg | ORAL_TABLET | Freq: Once | ORAL | Status: AC
  Administered 2023-11-26: 4 mg via ORAL
  Filled 2023-11-26: qty 1

## 2023-11-26 MED ORDER — SODIUM CHLORIDE 0.9 % IV BOLUS
1000.0000 mL | Freq: Once | INTRAVENOUS | Status: AC
  Administered 2023-11-26: 1000 mL via INTRAVENOUS

## 2023-11-26 MED ORDER — ONDANSETRON HCL 4 MG PO TABS: 4.0000 mg | ORAL_TABLET | Freq: Four times a day (QID) | ORAL | Status: DC | PRN

## 2023-11-26 MED ORDER — ACETAMINOPHEN 650 MG RE SUPP: 650.0000 mg | Freq: Four times a day (QID) | RECTAL | Status: DC | PRN

## 2023-11-26 MED ORDER — IOHEXOL 350 MG/ML SOLN
75.0000 mL | Freq: Once | INTRAVENOUS | Status: AC | PRN
  Administered 2023-11-26: 75 mL via INTRAVENOUS
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
## 2023-11-26 NOTE — ED Provider Notes (Signed)
 [facility] Provider Note   CSN: [id number] Arrival date & time: 11/26/23  1710     History  Chief Complaint  Patient presents with   Hematemesis   Abdominal Pain    Ryan Blackburn is a 43 y.o. male with history of seizure disorder not currently on antiepileptics, traumatic brain injury, depression, chronic headaches, polysubstance abuse, NSTEMI s/p coronary stent in 2021, who presents the emergency department complaining of abdominal pain and hematemesis.  Patient states that symptoms started upon waking this morning.  He describes pain in his lower abdomen, worse on the right side radiating down into his scrotum.  Also having dysuria and hematuria.  No blood per rectum, has not had a bowel movement in few days.  Endorses drug use last night, is unsure what his friend gave him.   Abdominal Pain Associated symptoms: dysuria, hematuria, nausea and vomiting        Home Medications Prior to Admission medications   Medication Sig Start Date End Date Taking? Authorizing Provider  clopidogrel  (PLAVIX ) 75 MG tablet Take 1 tablet (75 mg total) by mouth daily. For PAD 11/08/23   Collene Gouge I, NP  FLUoxetine  (PROZAC ) 20 MG capsule Take 1 capsule (20 mg total) by mouth daily. For depression 11/08/23   Collene Gouge I, NP  hydrOXYzine  (ATARAX ) 25 MG tablet Take 1 tablet (25 mg total) by mouth every 6 (six) hours as needed for anxiety. 11/07/23   Collene Gouge I, NP  Melatonin 10 MG TABS Take 10 mg by mouth at bedtime. For sleep 11/07/23   Collene Gouge I, NP  nicotine  polacrilex (NICORETTE ) 2 MG gum Take 1 each (2 mg total) by mouth as needed. (May buy from over the counter): For smoking cessation 11/07/23   Collene Gouge I, NP  traZODone  (DESYREL ) 50 MG tablet Take 1 tablet (50 mg total) by mouth at bedtime as needed for sleep. 11/07/23   Collene Gouge I, NP  escitalopram  (LEXAPRO ) 10 MG tablet Take 1 tablet (10 mg total) by mouth daily. Take half tablet  for first 10 days and increase to 10mg  mg if tolerating. Patient not taking: Reported on 02/20/2016 12/08/15 02/20/16  Geralene Kaiser, MD      Allergies    Valproic acid and related, Dilantin [phenytoin sodium extended], Keppra  [levetiracetam ], Mushroom extract complex (do not select), Penicillins, and Zonegran  [zonisamide ]    Review of Systems   Review of Systems  Constitutional:  Positive for diaphoresis.  Gastrointestinal:  Positive for abdominal pain, nausea and vomiting.  Genitourinary:  Positive for dysuria and hematuria.  Psychiatric/Behavioral:  The patient is nervous/anxious.   All other systems reviewed and are negative.   Physical Exam Updated Vital Signs BP (!) 101/55   Pulse (!) 105   Temp (!) 97.4 F (36.3 C) (Oral)   Resp (!) 21   Ht 5' 9 (1.753 m)   Wt 70.3 kg   SpO2 98%   BMI 22.89 kg/m  Physical Exam Vitals and nursing note reviewed.  Constitutional:      Appearance: Normal appearance. He is ill-appearing.     Comments: [place] by bedside with coffee ground emesis  HENT:     Head: Normocephalic and atraumatic.  Eyes:     Conjunctiva/sclera: Conjunctivae normal.  Cardiovascular:     Rate and Rhythm: Regular rhythm. Tachycardia present.  Pulmonary:     Effort: Pulmonary effort is normal. No respiratory distress.     Breath sounds: Normal breath sounds.  Abdominal:  General: There is no distension.     Palpations: Abdomen is soft.     Tenderness: There is abdominal tenderness in the right lower quadrant and suprapubic area. There is guarding.  Skin:    General: Skin is warm and moist.     Coloration: Skin is pale.  Neurological:     General: No focal deficit present.     Mental Status: He is alert.     ED Results / Procedures / Treatments   Labs (all labs ordered are listed, but only abnormal results are displayed) Labs Reviewed  COMPREHENSIVE METABOLIC PANEL - Abnormal; Notable for the following components:      Result Value   BUN 35 (*)     Albumin 3.4 (*)    All other components within normal limits  CBC - Abnormal; Notable for the following components:   RBC 4.07 (*)    Hemoglobin 12.0 (*)    HCT 36.5 (*)    All other components within normal limits  URINALYSIS, ROUTINE W REFLEX MICROSCOPIC - Abnormal; Notable for the following components:   APPearance CLOUDY (*)    pH 9.0 (*)    Ketones, ur 5 (*)    All other components within normal limits  RAPID URINE DRUG SCREEN, HOSP PERFORMED - Abnormal; Notable for the following components:   Amphetamines POSITIVE (*)    All other components within normal limits  LIPASE, BLOOD  TYPE AND SCREEN  ABO/RH  TROPONIN I (HIGH SENSITIVITY)  TROPONIN I (HIGH SENSITIVITY)    EKG EKG Interpretation Date/Time:  Wednesday November 26 2023 17:25:14 EST Ventricular Rate:  101 PR Interval:  127 QRS Duration:  100 QT Interval:  337 QTC Calculation: 437 R Axis:   -16  Text Interpretation: Sinus tachycardia Nonspecific T wave abnormality Confirmed by Bernard Drivers ([id number]) on 11/26/2023 6:02:10 PM  Radiology No results found.  Procedures Procedures    Medications Ordered in ED Medications  iohexol  (OMNIPAQUE ) 350 MG/ML injection 75 mL (has no administration in time range)  sodium chloride  0.9 % bolus 1,000 mL (0 mLs Intravenous Stopped 11/26/23 2106)  pantoprazole  (PROTONIX ) injection 80 mg (80 mg Intravenous Given 11/26/23 1953)  ondansetron  (ZOFRAN -ODT) disintegrating tablet 4 mg (4 mg Oral Given 11/26/23 1820)  sodium chloride  0.9 % bolus 1,000 mL (1,000 mLs Intravenous New Bag/Given 11/26/23 2148)    ED Course/ Medical Decision Making/ A&P                                 Medical Decision Making Amount and/or Complexity of Data Reviewed Labs: ordered.   This patient is a 43 y.o. male  who presents to the ED for concern of hematemesis.   Differential diagnoses prior to evaluation: The emergent differential diagnosis includes, but is not limited to, PUD, gastritis, esophagitis,  Mallory-Weiss tear, GERD, esophageal varices, malignancy. This is not an exhaustive differential.   Past Medical History / Co-morbidities / Social History: seizure disorder not currently on antiepileptics, traumatic brain injury, depression, chronic headaches, polysubstance abuse, NSTEMI s/p coronary stent in 2021  Additional history: Chart reviewed. Pertinent results include: reviewed most recent behavioral health hospitalization for medication verification  Physical Exam: Physical exam performed. The pertinent findings include: Mildly tachycardic, with some soft blood pressure, appears to be somewhat at baseline.  Abdomen soft, with some right lower quadrant tenderness to palpation.  At bedside patient is a been filled with coffee-ground emesis.  Lab Tests/Imaging studies: I  personally interpreted labs/imaging and the pertinent results include: Hemoglobin 12.0 (compared to 14.1 two months ago).  BUN 35, otherwise CMP grossly unremarkable.  UA without hematuria or infection.  Negative troponin.  Normal lipase.  UDS positive for methamphetamines.  Type and screen collected.  CT pending at time of admission.   Cardiac monitoring: EKG obtained and interpreted by myself and attending physician which shows: Sinus tachycardia   Medications: I ordered medication including IVF, protonix .  I have reviewed the patients home medicines and have made adjustments as needed.  Consultations obtained: I consulted with gastroenterologist Dr Rollin who recommended: GI consultation in AM  I consulted with hospitalist Dr Charlton who recommended: admission   Disposition: After consideration of the diagnostic results and the patients response to treatment, I feel that patient would benefit from admission for further evaluation of hematemesis with coffee-ground emesis.  Final Clinical Impression(s) / ED Diagnoses Final diagnoses:  Coffee ground emesis  RLQ abdominal pain    Rx / DC Orders ED Discharge  Orders     None      Portions of this report may have been transcribed using voice recognition software. Every effort was made to ensure accuracy; however, inadvertent computerized transcription errors may be present.    Ethelyne Erich T, PA-C 11/26/23 2205    Bernard Drivers, MD 12/01/23 1421

## 2023-11-26 NOTE — ED Notes (Signed)
 Awaiting IV team due to unsuccessful IV starts. EDP notified of delay.

## 2023-11-26 NOTE — ED Notes (Signed)
 IV team at bedside

## 2023-11-26 NOTE — ED Notes (Signed)
 Pt reports dysuria, stating "It hurts when I pee and there's blood in my pee too."

## 2023-11-26 NOTE — ED Notes (Signed)
 Patient transported to CT

## 2023-11-27 ENCOUNTER — Inpatient Hospital Stay (HOSPITAL_COMMUNITY): Payer: 59 | Admitting: Anesthesiology

## 2023-11-27 ENCOUNTER — Encounter (HOSPITAL_COMMUNITY): Payer: Self-pay | Admitting: Family Medicine

## 2023-11-27 ENCOUNTER — Encounter (HOSPITAL_COMMUNITY)
Admission: EM | Disposition: A | Discharge status: Home / Self Care | Payer: Self-pay | Source: Home / Self Care | Attending: Family Medicine

## 2023-11-27 DIAGNOSIS — K2211 Ulcer of esophagus with bleeding: Secondary | ICD-10-CM | POA: Diagnosis not present

## 2023-11-27 DIAGNOSIS — F17210 Nicotine dependence, cigarettes, uncomplicated: Secondary | ICD-10-CM

## 2023-11-27 DIAGNOSIS — K922 Gastrointestinal hemorrhage, unspecified: Secondary | ICD-10-CM | POA: Diagnosis not present

## 2023-11-27 DIAGNOSIS — Z7901 Long term (current) use of anticoagulants: Secondary | ICD-10-CM

## 2023-11-27 DIAGNOSIS — I2510 Atherosclerotic heart disease of native coronary artery without angina pectoris: Secondary | ICD-10-CM

## 2023-11-27 DIAGNOSIS — D62 Acute posthemorrhagic anemia: Secondary | ICD-10-CM | POA: Diagnosis not present

## 2023-11-27 DIAGNOSIS — R1032 Left lower quadrant pain: Secondary | ICD-10-CM

## 2023-11-27 DIAGNOSIS — K289 Gastrojejunal ulcer, unspecified as acute or chronic, without hemorrhage or perforation: Secondary | ICD-10-CM

## 2023-11-27 DIAGNOSIS — K2210 Ulcer of esophagus without bleeding: Secondary | ICD-10-CM

## 2023-11-27 DIAGNOSIS — Z7902 Long term (current) use of antithrombotics/antiplatelets: Secondary | ICD-10-CM | POA: Diagnosis not present

## 2023-11-27 DIAGNOSIS — K209 Esophagitis, unspecified without bleeding: Secondary | ICD-10-CM

## 2023-11-27 DIAGNOSIS — K920 Hematemesis: Secondary | ICD-10-CM | POA: Diagnosis not present

## 2023-11-27 HISTORY — PX: SCLEROTHERAPY: SHX6841

## 2023-11-27 HISTORY — PX: HOT HEMOSTASIS: SHX5433

## 2023-11-27 HISTORY — PX: ESOPHAGOGASTRODUODENOSCOPY (EGD) WITH PROPOFOL: SHX5813

## 2023-11-27 LAB — PREPARE RBC (CROSSMATCH)

## 2023-11-27 LAB — HEMOGLOBIN AND HEMATOCRIT, BLOOD
HCT: 26.3 % — ABNORMAL LOW (ref 39.0–52.0)
HCT: 27 % — ABNORMAL LOW (ref 39.0–52.0)
HCT: 28.2 % — ABNORMAL LOW (ref 39.0–52.0)
HCT: 32.9 % — ABNORMAL LOW (ref 39.0–52.0)
Hemoglobin: 8.5 g/dL — ABNORMAL LOW (ref 13.0–17.0)
Hemoglobin: 8.8 g/dL — ABNORMAL LOW (ref 13.0–17.0)
Hemoglobin: 9.3 g/dL — ABNORMAL LOW (ref 13.0–17.0)
Hemoglobin: 10.6 g/dL — ABNORMAL LOW (ref 13.0–17.0)

## 2023-11-27 LAB — BASIC METABOLIC PANEL
Anion gap: 4 — ABNORMAL LOW (ref 5–15)
BUN: 36 mg/dL — ABNORMAL HIGH (ref 6–20)
CO2: 25 mmol/L (ref 22–32)
Calcium: 8 mg/dL — ABNORMAL LOW (ref 8.9–10.3)
Chloride: 111 mmol/L (ref 98–111)
Creatinine, Ser: 0.74 mg/dL (ref 0.61–1.24)
GFR, Estimated: >60 mL/min (ref >60)
Glucose, Bld: 90 mg/dL (ref 70–99)
Potassium: 4.2 mmol/L (ref 3.5–5.1)
Sodium: 140 mmol/L (ref 135–145)

## 2023-11-27 LAB — I-STAT CHEM 8, ED
BUN: 35 mg/dL — ABNORMAL HIGH (ref 6–20)
Calcium, Ion: 1.17 mmol/L (ref 1.15–1.40)
Chloride: 107 mmol/L (ref 98–111)
Creatinine, Ser: 0.8 mg/dL (ref 0.61–1.24)
Glucose, Bld: 85 mg/dL (ref 70–99)
HCT: 24 % — ABNORMAL LOW (ref 39.0–52.0)
Hemoglobin: 8.2 g/dL — ABNORMAL LOW (ref 13.0–17.0)
Potassium: 4.2 mmol/L (ref 3.5–5.1)
Sodium: 142 mmol/L (ref 135–145)
TCO2: 24 mmol/L (ref 22–32)

## 2023-11-27 LAB — HIV ANTIBODY (ROUTINE TESTING W REFLEX): HIV Screen 4th Generation wRfx: NONREACTIVE

## 2023-11-27 SURGERY — ESOPHAGOGASTRODUODENOSCOPY (EGD) WITH PROPOFOL
Anesthesia: General

## 2023-11-27 MED ORDER — LIDOCAINE 2% (20 MG/ML) 5 ML SYRINGE
INTRAMUSCULAR | Status: DC | PRN
  Administered 2023-11-27: 80 mg via INTRAVENOUS

## 2023-11-27 MED ORDER — SUCCINYLCHOLINE CHLORIDE 200 MG/10ML IV SOSY
PREFILLED_SYRINGE | INTRAVENOUS | Status: DC | PRN
  Administered 2023-11-27: 120 mg via INTRAVENOUS

## 2023-11-27 MED ORDER — METOCLOPRAMIDE HCL 5 MG/ML IJ SOLN: 10.0000 mg | Freq: Once | INTRAMUSCULAR | Status: DC

## 2023-11-27 MED ORDER — ONDANSETRON HCL 4 MG/2ML IJ SOLN
INTRAMUSCULAR | Status: DC | PRN
  Administered 2023-11-27: 4 mg via INTRAVENOUS

## 2023-11-27 MED ORDER — EPINEPHRINE 1 MG/10ML IJ SOSY
PREFILLED_SYRINGE | INTRAMUSCULAR | Status: AC
  Filled 2023-11-27: qty 10

## 2023-11-27 MED ORDER — SODIUM CHLORIDE 0.9 % IV SOLN: INTRAVENOUS | Status: DC

## 2023-11-27 MED ORDER — PHENYLEPHRINE 80 MCG/ML (10ML) SYRINGE FOR IV PUSH (FOR BLOOD PRESSURE SUPPORT)
PREFILLED_SYRINGE | INTRAVENOUS | Status: DC | PRN
  Administered 2023-11-27 (×3): 80 ug via INTRAVENOUS
  Administered 2023-11-27: 160 ug via INTRAVENOUS
  Administered 2023-11-27 (×2): 80 ug via INTRAVENOUS

## 2023-11-27 MED ORDER — EPINEPHRINE 1 MG/10ML IJ SOSY
PREFILLED_SYRINGE | INTRAMUSCULAR | Status: DC | PRN
  Administered 2023-11-27: .5 mg via SUBCUTANEOUS

## 2023-11-27 MED ORDER — PROPOFOL 10 MG/ML IV BOLUS
INTRAVENOUS | Status: DC | PRN
  Administered 2023-11-27: 140 mg via INTRAVENOUS

## 2023-11-27 MED ORDER — SODIUM CHLORIDE 0.9 % IV BOLUS
500.0000 mL | Freq: Once | INTRAVENOUS | Status: AC
  Administered 2023-11-27: 500 mL via INTRAVENOUS

## 2023-11-27 MED ORDER — SODIUM CHLORIDE 0.9 % IV SOLN: INTRAVENOUS | Status: DC | PRN

## 2023-11-27 MED ORDER — FENTANYL CITRATE (PF) 100 MCG/2ML IJ SOLN
INTRAMUSCULAR | Status: AC
  Filled 2023-11-27: qty 2

## 2023-11-27 MED ORDER — SODIUM CHLORIDE 0.9% IV SOLUTION: Freq: Once | INTRAVENOUS | Status: AC

## 2023-11-27 MED ORDER — PHENYLEPHRINE HCL-NACL 20-0.9 MG/250ML-% IV SOLN
INTRAVENOUS | Status: DC | PRN
  Administered 2023-11-27: 40 ug/min via INTRAVENOUS

## 2023-11-27 MED ORDER — SUCRALFATE 1 GM/10ML PO SUSP
1.0000 g | Freq: Three times a day (TID) | ORAL | Status: DC
  Administered 2023-11-27 – 2023-11-30 (×9): 1 g via ORAL
  Filled 2023-11-27 (×12): qty 10

## 2023-11-27 MED ORDER — DEXAMETHASONE SODIUM PHOSPHATE 10 MG/ML IJ SOLN
INTRAMUSCULAR | Status: DC | PRN
  Administered 2023-11-27: 5 mg via INTRAVENOUS

## 2023-11-27 SURGICAL SUPPLY — 14 items

## 2023-11-27 NOTE — ED Notes (Signed)
 Linens changed due to patient urinating on self.

## 2023-11-27 NOTE — ED Notes (Signed)
 RN called CCMD for cardiac monitoring.

## 2023-11-27 NOTE — Transfer of Care (Signed)
 Immediate Anesthesia Transfer of Care Note  Patient: Ryan Blackburn  Procedure(s) Performed: ESOPHAGOGASTRODUODENOSCOPY (EGD) WITH PROPOFOL  HOT HEMOSTASIS (ARGON PLASMA COAGULATION/BICAP) SCLEROTHERAPY  Patient Location: PACU and Endoscopy Unit  Anesthesia Type:General  Level of Consciousness: drowsy and responds to stimulation  Airway & Oxygen Therapy: Patient Spontanous Breathing  Post-op Assessment: Report given to RN and Post -op Vital signs reviewed and stable  Post vital signs: Reviewed and stable  Last Vitals:  Vitals Value Taken Time  BP 135/78 11/27/23 1400  Temp 36.6 C 11/27/23 1400  Pulse 94 11/27/23 1405  Resp 24 11/27/23 1405  SpO2 99 % 11/27/23 1405  Vitals shown include unfiled device data.  Last Pain:  Vitals:   11/27/23 1400  TempSrc: Temporal  PainSc:          Complications: No notable events documented.

## 2023-11-27 NOTE — Anesthesia Procedure Notes (Signed)
 Procedure Name: Intubation Date/Time: 11/27/2023 1:25 PM  Performed by: Nicholaus Ethelene SAILOR, CRNAPre-anesthesia Checklist: Patient identified, Emergency Drugs available, Suction available and Patient being monitored Patient Re-evaluated:Patient Re-evaluated prior to induction Oxygen Delivery Method: Circle System Utilized Preoxygenation: Pre-oxygenation with 100% oxygen Induction Type: IV induction, Rapid sequence and Cricoid Pressure applied Laryngoscope Size: Mac and 4 Grade View: Grade II Tube type: Oral Tube size: 7.5 mm Number of attempts: 1 Airway Equipment and Method: Stylet and Oral airway Placement Confirmation: ETT inserted through vocal cords under direct vision, positive ETCO2 and breath sounds checked- equal and bilateral Secured at: 23 cm Tube secured with: Tape Dental Injury: Teeth and Oropharynx as per pre-operative assessment

## 2023-11-27 NOTE — ED Notes (Signed)
Pt vomiting dark colored emesis.
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
## 2023-11-27 NOTE — Op Note (Signed)
 [facility] Patient Name: Ryan Blackburn Procedure Date : 11/27/2023 MRN: [id number] Attending MD: Elspeth SQUIBB. Leigh , MD, [id number] Date of Birth: 06-Oct-1981 CSN: [id number] Age: 43 Admit Type: Inpatient Procedure:                Upper GI endoscopy Indications:              Hematemesis, history of Eliquis use for DVT - last                            dose yesterday, also on Plavix  but states not                            taking recently. Providers:                Elspeth SQUIBB. Leigh, MD, Particia Fischer, RN, Burnard Fire RN, RN, Felice Sar, Technician, Fairy Marina, Technician Referring MD:              Medicines:                Monitored Anesthesia Care Complications:            No immediate complications. Estimated blood loss:                            Minimal. Estimated Blood Loss:     Estimated blood loss was minimal. Procedure:                Pre-Anesthesia Assessment:                           - Prior to the procedure, a History and Physical                            was performed, and patient medications and                            allergies were reviewed. The patient's tolerance of                            previous anesthesia was also reviewed. The risks                            and benefits of the procedure and the sedation                            options and risks were discussed with the patient.                            All questions were answered, and informed consent  was obtained. Prior Anticoagulants: The patient has                            taken Eliquis (apixaban), last dose was 1 day prior                            to procedure. ASA Grade Assessment: III - A patient                            with severe systemic disease. After reviewing the                            risks and benefits, the patient was deemed in                            satisfactory  condition to undergo the procedure.                           After obtaining informed consent, the endoscope was                            passed under direct vision. Throughout the                            procedure, the patient's blood pressure, pulse, and                            oxygen saturations were monitored continuously. The                            [id number]-H190 ([id number] Olympus endoscope was introduced                            through the mouth, and advanced to the second part                            of duodenum. The upper GI endoscopy was                            accomplished without difficulty. The patient                            tolerated the procedure well. Scope In: Scope Out: Findings:      Esophagogastric landmarks were identified: the Z-line was found at 41       cm, the gastroesophageal junction was found at 41 cm and the upper       extent of the gastric folds was found at 41 cm from the incisors.      Esophagitis was noted at the GEJ with a few cratered esophageal ulcers.       No active bleeding noted, however one of the ulcerated area in the GEJ /       cardia had stigmata of recent bleeding with a visible vessel. Area was       successfully  injected with 4 to 5 mL of a 0.1 mg/mL solution of       epinephrine  for drug delivery. Fulguration to ablate the lesion to       prevent bleeding by Goldprobe was successful. Due to the location of the       lesion it was not easily accessible to a hemostasis clip.      The exam of the esophagus was otherwise normal.      The entire examined stomach was normal.      Diffuse erythematous mucosa was found in the duodenal bulb.      The exam of the duodenum was otherwise normal. Impression:               - Esophagogastric landmarks identified.                           - Esophagitis at the GEJ with ulcerations there and                            the cardia, one area stigmata of recent bleeding                             (visible vessel). Injected. Treated with Goldprobe                           - Normal stomach.                           - Erythematous duodenopathy.                           - Normal duodenum otherwise. Recommendation:           - Return patient to hospital ward for ongoing care.                           - Clear liquid diet later today if he is otherwise                            not having any recurrent bleeding                           - Continue present medications.                           - Continue protonix  40mg  IV twice daily for now                           - Can take liquid carafate  10cc every 8 hours                           - Continue to hold Eliquis / Plavix                            - Trend Hgb                           -  We will reassess him tomorrow. Call in the                            interim with any recurrent bleeding Procedure Code(s):        --- Professional ---                           [phone], Esophagogastroduodenoscopy, flexible,                            transoral; with control of bleeding, any method                           43236, 59, Esophagogastroduodenoscopy, flexible,                            transoral; with directed submucosal injection(s),                            any substance Diagnosis Code(s):        --- Professional ---                           K22.11, Ulcer of esophagus with bleeding                           K31.89, Other diseases of stomach and duodenum                           K92.0, Hematemesis CPT copyright 2022 American Medical Association. All rights reserved. The codes documented in this report are preliminary and upon coder review may  be revised to meet current compliance requirements. Elspeth P. Josslynn Mentzer, MD 11/27/2023 1:59:15 PM This report has been signed electronically. Number of Addenda: 0
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
## 2023-11-27 NOTE — Anesthesia Preprocedure Evaluation (Addendum)
 Anesthesia Evaluation  Patient identified by MRN, date of birth, ID band Patient awake    Reviewed: Allergy & Precautions, NPO status , Patient's Chart, lab work & pertinent test results  Airway Mallampati: II  TM Distance: >3 FB Neck ROM: Full    Dental  (+) Dental Advisory Given, Missing, Chipped, Poor Dentition   Pulmonary Current Smoker and Patient abstained from smoking.   Pulmonary exam normal breath sounds clear to auscultation       Cardiovascular + CAD, + Past MI and + Cardiac Stents  Normal cardiovascular exam Rhythm:Regular Rate:Normal  Echo 04/2020  1. Mid and basal inferior wall hypokinesis No effusion or mechanical complication from recent MI . Left ventricular ejection fraction, by estimation, is 50 to 55%. The left ventricle has low normal function. The left ventricle demonstrates regional wall motion abnormalities (see scoring diagram/findings for description). There is mild left ventricular hypertrophy. Left ventricular diastolic parameters were normal.   2. Right ventricular systolic function is normal. The right ventricular size is normal.   3. The mitral valve is normal in structure. Trivial mitral valve regurgitation. No evidence of mitral stenosis.   4. The aortic valve is tricuspid. Aortic valve regurgitation is not visualized. No aortic stenosis is present.   5. The inferior vena cava is normal in size with greater than 50% respiratory variability, suggesting right atrial pressure of 3 mmHg.      Neuro/Psych  Headaches, Seizures -,  PSYCHIATRIC DISORDERS Anxiety Depression       GI/Hepatic negative GI ROS, Neg liver ROS,,,  Endo/Other  negative endocrine ROS    Renal/GU negative Renal ROS     Musculoskeletal  (+) Arthritis ,    Abdominal   Peds  Hematology negative hematology ROS (+)   Anesthesia Other Findings   Reproductive/Obstetrics                              Anesthesia Physical Anesthesia Plan  ASA: 3 and emergent  Anesthesia Plan: General   Post-op Pain Management:    Induction: Intravenous, Rapid sequence and Cricoid pressure planned  PONV Risk Score and Plan: 1 and Ondansetron  and Dexamethasone   Airway Management Planned: Oral ETT  Additional Equipment:   Intra-op Plan:   Post-operative Plan: Extubation in OR  Informed Consent: I have reviewed the patients History and Physical, chart, labs and discussed the procedure including the risks, benefits and alternatives for the proposed anesthesia with the patient or authorized representative who has indicated his/her understanding and acceptance.     Dental advisory given  Plan Discussed with: CRNA  Anesthesia Plan Comments:         Anesthesia Quick Evaluation

## 2023-11-28 ENCOUNTER — Encounter (HOSPITAL_COMMUNITY): Payer: Self-pay | Admitting: Gastroenterology

## 2023-11-28 DIAGNOSIS — D62 Acute posthemorrhagic anemia: Secondary | ICD-10-CM | POA: Diagnosis not present

## 2023-11-28 DIAGNOSIS — R1031 Right lower quadrant pain: Secondary | ICD-10-CM

## 2023-11-28 DIAGNOSIS — K920 Hematemesis: Secondary | ICD-10-CM | POA: Diagnosis not present

## 2023-11-28 DIAGNOSIS — K922 Gastrointestinal hemorrhage, unspecified: Secondary | ICD-10-CM | POA: Diagnosis not present

## 2023-11-28 DIAGNOSIS — K2211 Ulcer of esophagus with bleeding: Secondary | ICD-10-CM | POA: Diagnosis not present

## 2023-11-28 DIAGNOSIS — Z7902 Long term (current) use of antithrombotics/antiplatelets: Secondary | ICD-10-CM | POA: Diagnosis not present

## 2023-11-28 LAB — TYPE AND SCREEN
ABO/RH(D): A POS
Antibody Screen: NEGATIVE
Unit division: 0

## 2023-11-28 LAB — CBC
HCT: 28.1 % — ABNORMAL LOW (ref 39.0–52.0)
HCT: 27.4 % — ABNORMAL LOW (ref 39.0–52.0)
Hemoglobin: 9.2 g/dL — ABNORMAL LOW (ref 13.0–17.0)
Hemoglobin: 9 g/dL — ABNORMAL LOW (ref 13.0–17.0)
MCH: 30.1 pg (ref 26.0–34.0)
MCH: 30.4 pg (ref 26.0–34.0)
MCHC: 32.7 g/dL (ref 30.0–36.0)
MCHC: 32.8 g/dL (ref 30.0–36.0)
MCV: 91.8 fL (ref 80.0–100.0)
MCV: 92.6 fL (ref 80.0–100.0)
Platelets: 200 10*3/uL (ref 150–400)
Platelets: 179 10*3/uL (ref 150–400)
RBC: 3.06 MIL/uL — ABNORMAL LOW (ref 4.22–5.81)
RBC: 2.96 MIL/uL — ABNORMAL LOW (ref 4.22–5.81)
RDW: 13.8 % (ref 11.5–15.5)
RDW: 13.7 % (ref 11.5–15.5)
WBC: 6.2 10*3/uL (ref 4.0–10.5)
WBC: 6.6 10*3/uL (ref 4.0–10.5)
nRBC: 0 % (ref 0.0–0.2)
nRBC: 0 % (ref 0.0–0.2)

## 2023-11-28 LAB — BASIC METABOLIC PANEL
Anion gap: 9 (ref 5–15)
BUN: 18 mg/dL (ref 6–20)
CO2: 20 mmol/L — ABNORMAL LOW (ref 22–32)
Calcium: 8.5 mg/dL — ABNORMAL LOW (ref 8.9–10.3)
Chloride: 113 mmol/L — ABNORMAL HIGH (ref 98–111)
Creatinine, Ser: 0.68 mg/dL (ref 0.61–1.24)
GFR, Estimated: >60 mL/min (ref >60)
Glucose, Bld: 114 mg/dL — ABNORMAL HIGH (ref 70–99)
Potassium: 3.8 mmol/L (ref 3.5–5.1)
Sodium: 142 mmol/L (ref 135–145)

## 2023-11-28 LAB — BPAM RBC
Blood Product Expiration Date: 202501202359
ISSUE DATE / TIME: 202501020452
Unit Type and Rh: 6200

## 2023-11-28 MED ORDER — SODIUM CHLORIDE 0.9 % IV BOLUS
500.0000 mL | Freq: Once | INTRAVENOUS | Status: AC
  Administered 2023-11-28: 500 mL via INTRAVENOUS
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
## 2023-11-28 NOTE — Anesthesia Postprocedure Evaluation (Signed)
 Anesthesia Post Note  Patient: Ryan Blackburn  Procedure(s) Performed: ESOPHAGOGASTRODUODENOSCOPY (EGD) WITH PROPOFOL  HOT HEMOSTASIS (ARGON PLASMA COAGULATION/BICAP) SCLEROTHERAPY     Patient location during evaluation: PACU Anesthesia Type: General Level of consciousness: sedated and patient cooperative Pain management: pain level controlled Vital Signs Assessment: post-procedure vital signs reviewed and stable Respiratory status: spontaneous breathing Cardiovascular status: stable Anesthetic complications: no   No notable events documented.  Last Vitals:  Vitals:   11/28/23 2100 11/28/23 2123  BP: (!) 80/44 (!) 92/58  Pulse: 81 83  Resp: 19 (!) 23  Temp:  36.7 C  SpO2: 100% 100%    Last Pain:  Vitals:   11/28/23 2123  TempSrc: Oral  PainSc:                  Norleen Pope

## 2023-11-28 NOTE — ED Notes (Signed)
 MD notified of patient's trend of BP by secure chat.
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
## 2023-11-28 NOTE — Progress Notes (Signed)
 Progress Note   Subjective  Patient without complaints. No further vomiting. No melena. Sleeping in bed, he denies complaints. Wants to eat.   Objective   Vital signs in last 24 hours: Temp:  [97.3 F (36.3 C)-98.7 F (37.1 C)] 97.5 F (36.4 C) (01/03 0420) Pulse Rate:  [64-99] 74 (01/03 0420) Resp:  [12-26] 19 (01/03 0420) BP: (90-143)/(55-98) 101/70 (01/03 0420) SpO2:  [98 %-100 %] 100 % (01/03 0420)   General:    white male in NAD Neurologic:  Alert and oriented,  grossly normal neurologically. Psych:  Cooperative. Normal mood and affect.  Intake/Output from previous day: 01/02 0701 - 01/03 0700 In: 3180.3 [I.V.:2845.3; Blood:335] Out: 1400 [Urine:1400] Intake/Output this shift: No intake/output data recorded.  Lab Results: Recent Labs    11/26/23 1820 11/27/23 0240 11/27/23 1136 11/27/23 2054 11/28/23 0230  WBC 8.9  --   --   --  6.2  HGB 12.0*   < > 9.3* 10.6* 9.2*  HCT 36.5*   < > 28.2* 32.9* 28.1*  PLT 282  --   --   --  200   < > = values in this interval not displayed.   BMET Recent Labs    11/26/23 1820 11/27/23 0321 11/27/23 0328 11/28/23 0246  NA 139 142 140 142  K 4.1 4.2 4.2 3.8  CL 103 107 111 113*  CO2 26  --  25 20*  GLUCOSE 91 85 90 114*  BUN 35* 35* 36* 18  CREATININE 0.95 0.80 0.74 0.68  CALCIUM  9.3  --  8.0* 8.5*   LFT Recent Labs    11/26/23 1820  PROT 6.6  ALBUMIN 3.4*  AST 17  ALT 16  ALKPHOS 60  BILITOT 0.7   PT/INR No results for input(s): LABPROT, INR in the last 72 hours.  Studies/Results: CT ABDOMEN PELVIS W CONTRAST Result Date: 11/26/2023 CLINICAL DATA:  Right lower quadrant pain with hematemesis EXAM: CT ABDOMEN AND PELVIS WITH CONTRAST TECHNIQUE: Multidetector CT imaging of the abdomen and pelvis was performed using the standard protocol following bolus administration of intravenous contrast. RADIATION DOSE REDUCTION: This exam was performed according to the departmental dose-optimization program  which includes automated exposure control, adjustment of the mA and/or kV according to patient size and/or use of iterative reconstruction technique. CONTRAST:  75mL OMNIPAQUE  IOHEXOL  350 MG/ML SOLN COMPARISON:  11/12/2023 FINDINGS: Lower chest: No acute abnormality. Hepatobiliary: No focal liver abnormality is seen. No gallstones, gallbladder wall thickening, or biliary dilatation. Pancreas: Unremarkable. No pancreatic ductal dilatation or surrounding inflammatory changes. Spleen: Normal in size without focal abnormality. Adrenals/Urinary Tract: Adrenal glands are within normal limits. Kidneys are well visualized bilaterally within normal enhancement pattern. Mild prominent extrarenal pelvis is noted on the left without obstructive change. This is slightly more prominent than that seen on the prior exam. The bladder is within normal limits. Stomach/Bowel: The appendix is within normal limits. No obstructive or inflammatory changes of the colon are seen. Small bowel is within normal limits. The stomach is significantly distended with food and ingested food stuffs. No findings to suggest GI hemorrhage in the stomach are seen. Vascular/Lymphatic: Aortic atherosclerosis. No enlarged abdominal or pelvic lymph nodes. Reproductive: Prostate is unremarkable. Other: No abdominal wall hernia or abnormality. No abdominopelvic ascites. Musculoskeletal: No acute or significant osseous findings. IMPRESSION: Mild prominence of the left renal collecting system when compared with the prior exam without obstructive stone. This is of uncertain significance. No other focal abnormality is noted. Electronically Signed  By: Oneil Devonshire M.D.   On: 11/26/2023 23:20       Assessment / Plan:    43 y/o male here with the following:  Upper GI bleed secondary to esophagitis / ulcer extending into the cardia Anemia - post hemorrhagic H/o DVT on Eliquis Antiplatelet use - reportedly on Plavix  at home but he is not taking it  currently  EGD yesterday showed distal esophagitis with an ulcer that extended from the GEJ into the cardia, with visible vessel. I treated it endoscopically with epinephrine  injection and Goldprobe cautery. He has not had any bleeding symptoms since the procedure and feels better today.  Okay to start clear liquid diet, and advance to full liquids later today if he does well. I'd keep him for observation today given endoscopic therapy performed yesterday in light of his anticoagulated status, is at risk for rebleeding. If he does okay today can likely be discharged later tomorrow.   Moving forward recommend he take protonix  40mg  BID for 6 weeks and then once daily thereafter. Can give him some liquid carafate  while he is in the hospital to help coat the ulcerations and for his symptoms. I'd continue to hold the Eliquis another few days if okay per primary service.  PLAN: - clear liquid diet today, full liquids later today. Soft diet tomorrow if he does well - continue protonix  40mg  IV BID for now - start liquid carafate  TID - anticipate discharge likely tomorrow PM if he does well - upon discharge recommend protonix  40mg  BID for 6 weeks and then once daily thereafter - hold Eliquis for another 2-3 days if okay per primary team - sounds like he is supposed to take Plavix  at home but has not per his report. Defer decision to resume that to primary team. If he does need to resume Plavix , would make sure he tolerates Eliquis for at least a few days prior to resuming Plavix   We will sign off for now, please call with questions moving forward.  Marcey Naval, MD [facility]

## 2023-11-29 DIAGNOSIS — K920 Hematemesis: Secondary | ICD-10-CM | POA: Diagnosis not present

## 2023-11-29 DIAGNOSIS — R1031 Right lower quadrant pain: Secondary | ICD-10-CM | POA: Diagnosis not present

## 2023-11-29 DIAGNOSIS — K922 Gastrointestinal hemorrhage, unspecified: Secondary | ICD-10-CM | POA: Diagnosis not present

## 2023-11-29 LAB — BASIC METABOLIC PANEL
Anion gap: 7 (ref 5–15)
BUN: 15 mg/dL (ref 6–20)
CO2: 24 mmol/L (ref 22–32)
Calcium: 8.5 mg/dL — ABNORMAL LOW (ref 8.9–10.3)
Chloride: 111 mmol/L (ref 98–111)
Creatinine, Ser: 0.79 mg/dL (ref 0.61–1.24)
GFR, Estimated: >60 mL/min (ref >60)
Glucose, Bld: 89 mg/dL (ref 70–99)
Potassium: 3.6 mmol/L (ref 3.5–5.1)
Sodium: 142 mmol/L (ref 135–145)

## 2023-11-29 LAB — CBC
HCT: 25.7 % — ABNORMAL LOW (ref 39.0–52.0)
Hemoglobin: 8.5 g/dL — ABNORMAL LOW (ref 13.0–17.0)
MCH: 30.4 pg (ref 26.0–34.0)
MCHC: 33.1 g/dL (ref 30.0–36.0)
MCV: 91.8 fL (ref 80.0–100.0)
Platelets: 175 10*3/uL (ref 150–400)
RBC: 2.8 MIL/uL — ABNORMAL LOW (ref 4.22–5.81)
RDW: 13.8 % (ref 11.5–15.5)
WBC: 5.2 10*3/uL (ref 4.0–10.5)
nRBC: 0 % (ref 0.0–0.2)

## 2023-11-29 LAB — GLUCOSE, CAPILLARY
Glucose-Capillary: 131 mg/dL — ABNORMAL HIGH (ref 70–99)
Glucose-Capillary: 202 mg/dL — ABNORMAL HIGH (ref 70–99)
Glucose-Capillary: 106 mg/dL — ABNORMAL HIGH (ref 70–99)

## 2023-11-29 LAB — HEMOGLOBIN AND HEMATOCRIT, BLOOD
HCT: 25.9 % — ABNORMAL LOW (ref 39.0–52.0)
Hemoglobin: 8.5 g/dL — ABNORMAL LOW (ref 13.0–17.0)
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
## 2023-11-29 NOTE — Plan of Care (Signed)

## 2023-11-30 DIAGNOSIS — K922 Gastrointestinal hemorrhage, unspecified: Secondary | ICD-10-CM | POA: Diagnosis not present

## 2023-11-30 LAB — BASIC METABOLIC PANEL WITH GFR
Anion gap: 6 (ref 5–15)
BUN: 18 mg/dL (ref 6–20)
CO2: 29 mmol/L (ref 22–32)
Calcium: 8.3 mg/dL — ABNORMAL LOW (ref 8.9–10.3)
Chloride: 107 mmol/L (ref 98–111)
Creatinine, Ser: 0.92 mg/dL (ref 0.61–1.24)
GFR, Estimated: >60 mL/min (ref >60)
Glucose, Bld: 97 mg/dL (ref 70–99)
Potassium: 3.4 mmol/L — ABNORMAL LOW (ref 3.5–5.1)
Sodium: 142 mmol/L (ref 135–145)

## 2023-11-30 LAB — HEMOGLOBIN AND HEMATOCRIT, BLOOD
HCT: 26.3 % — ABNORMAL LOW (ref 39.0–52.0)
Hemoglobin: 8.7 g/dL — ABNORMAL LOW (ref 13.0–17.0)

## 2023-11-30 LAB — GLUCOSE, CAPILLARY
Glucose-Capillary: 111 mg/dL — ABNORMAL HIGH (ref 70–99)
Glucose-Capillary: 118 mg/dL — ABNORMAL HIGH (ref 70–99)

## 2023-11-30 MED ORDER — PANTOPRAZOLE SODIUM 40 MG PO TBEC: 40.0000 mg | DELAYED_RELEASE_TABLET | Freq: Two times a day (BID) | ORAL | 1 refills | Status: AC

## 2023-11-30 MED ORDER — SUCRALFATE 1 GM/10ML PO SUSP: 1.0000 g | Freq: Three times a day (TID) | ORAL | 0 refills | Status: AC
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
## 2023-11-30 NOTE — Plan of Care (Signed)
   Problem: Education: Goal: Knowledge of General Education information will improve Description Including pain rating scale, medication(s)/side effects and non-pharmacologic comfort measures Outcome: Progressing

## 2024-03-13 IMAGING — DX DG CHEST 1V PORT
1 series · 1 of 1 positions shown · non-contrast
Comparison: 10/15/2021

CLINICAL DATA: Chest pain

EXAM:
PORTABLE CHEST 1 VIEW

[chest]
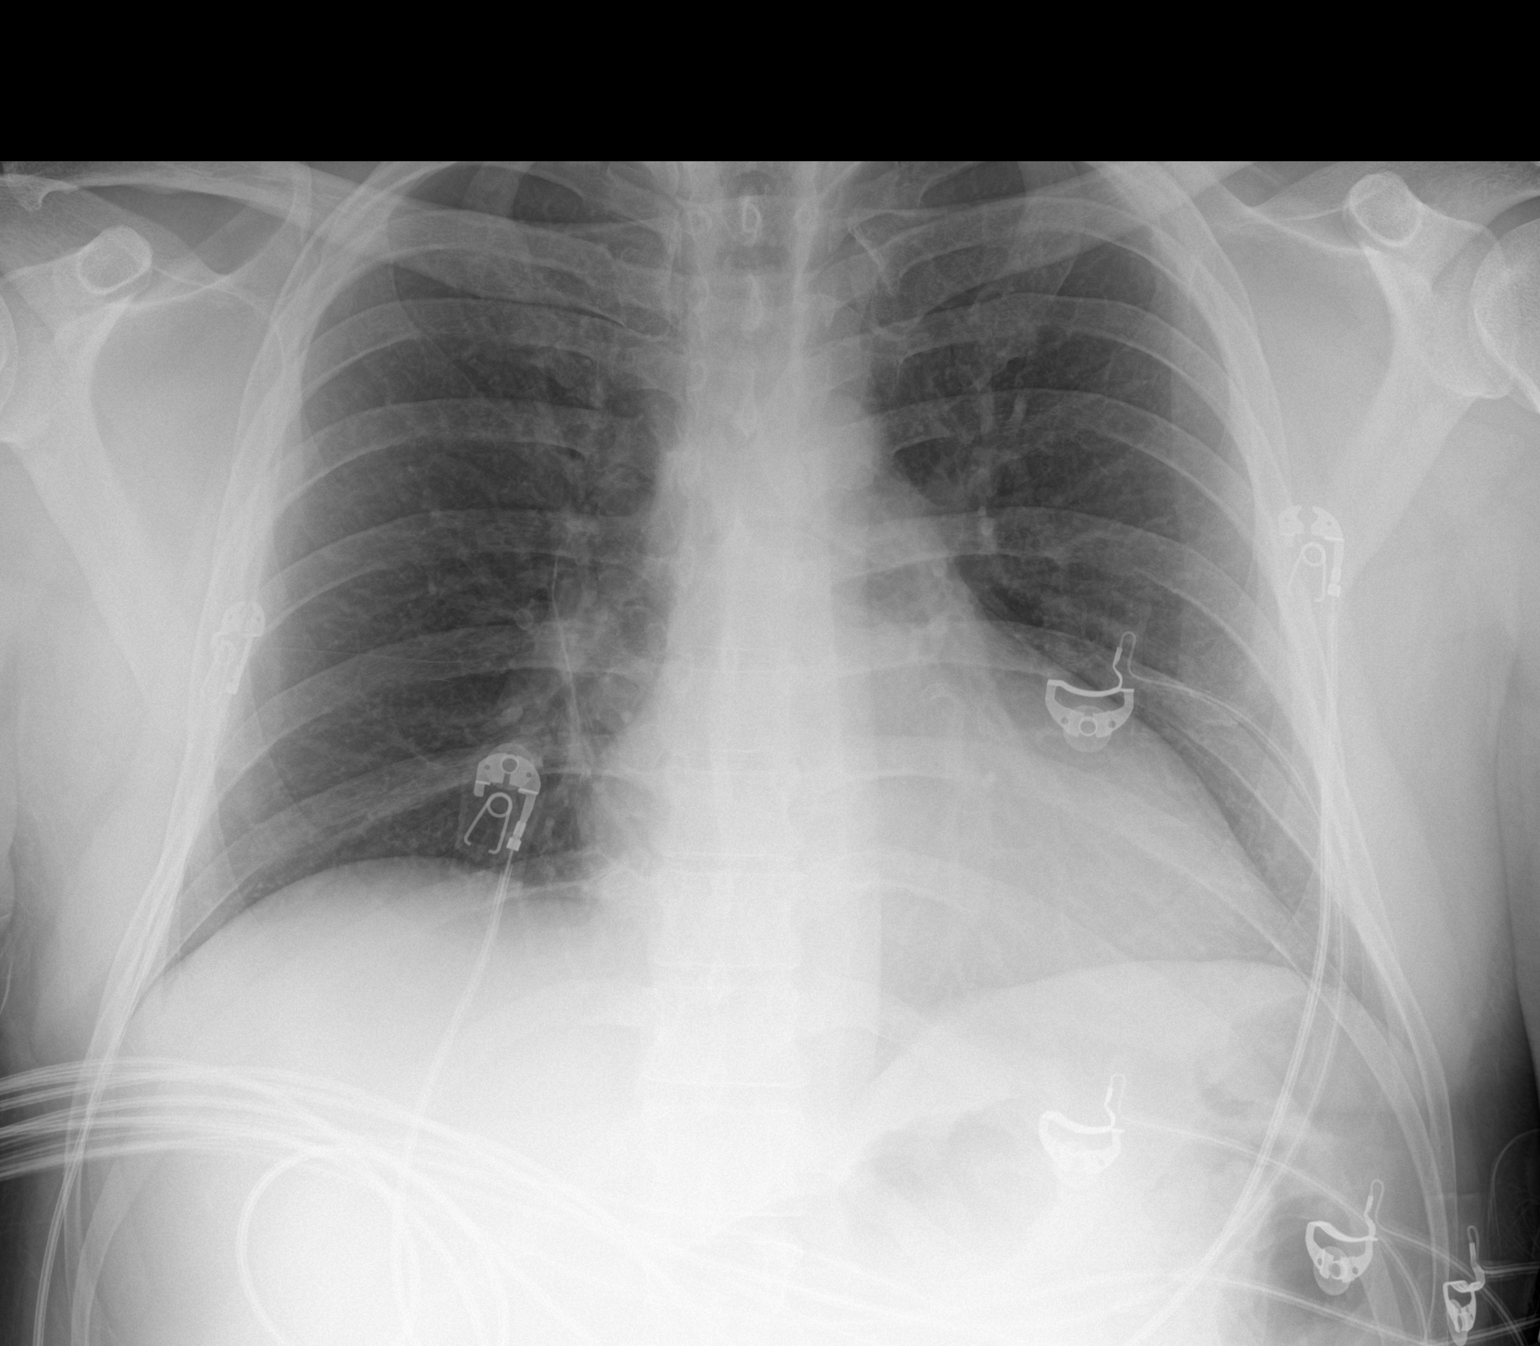

[1 of 1 positions shown; findings below may reference images not displayed]

FINDINGS: Lungs are clear.  No pleural effusion or pneumothorax.

The heart is normal in size.
IMPRESSION: No evidence of acute cardiopulmonary disease.
